# Patient Record
Sex: Male | Born: 1950 | Race: White | Hispanic: No | Marital: Married | Smoking: Former smoker
Health system: Southern US, Community
[De-identification: ages and names within clinical notes are randomized; demographics above are authoritative.]

## PROBLEM LIST (undated history)

## (undated) DIAGNOSIS — M1990 Unspecified osteoarthritis, unspecified site: Secondary | ICD-10-CM

## (undated) DIAGNOSIS — I719 Aortic aneurysm of unspecified site, without rupture: Secondary | ICD-10-CM

## (undated) DIAGNOSIS — I491 Atrial premature depolarization: Secondary | ICD-10-CM

## (undated) DIAGNOSIS — N200 Calculus of kidney: Secondary | ICD-10-CM

## (undated) DIAGNOSIS — K259 Gastric ulcer, unspecified as acute or chronic, without hemorrhage or perforation: Secondary | ICD-10-CM

## (undated) DIAGNOSIS — C649 Malignant neoplasm of unspecified kidney, except renal pelvis: Secondary | ICD-10-CM

## (undated) DIAGNOSIS — E875 Hyperkalemia: Secondary | ICD-10-CM

## (undated) DIAGNOSIS — I351 Nonrheumatic aortic (valve) insufficiency: Secondary | ICD-10-CM

## (undated) DIAGNOSIS — I10 Essential (primary) hypertension: Secondary | ICD-10-CM

## (undated) DIAGNOSIS — R011 Cardiac murmur, unspecified: Secondary | ICD-10-CM

## (undated) DIAGNOSIS — E785 Hyperlipidemia, unspecified: Secondary | ICD-10-CM

## (undated) DIAGNOSIS — I77810 Thoracic aortic ectasia: Secondary | ICD-10-CM

## (undated) HISTORY — DX: Malignant neoplasm of unspecified kidney, except renal pelvis: C64.9

## (undated) HISTORY — DX: Thoracic aortic ectasia: I77.810

## (undated) HISTORY — DX: Essential (primary) hypertension: I10

## (undated) HISTORY — PX: TONSILLECTOMY: SUR1361

## (undated) HISTORY — DX: Nonrheumatic aortic (valve) insufficiency: I35.1

## (undated) HISTORY — DX: Hyperkalemia: E87.5

## (undated) HISTORY — PX: KNEE SURGERY: SHX244

## (undated) HISTORY — DX: Hyperlipidemia, unspecified: E78.5

## (undated) HISTORY — PX: CATARACT EXTRACTION: SUR2

## (undated) HISTORY — DX: Atrial premature depolarization: I49.1

## (undated) HISTORY — PX: KIDNEY SURGERY: SHX687

## (undated) HISTORY — DX: Gastric ulcer, unspecified as acute or chronic, without hemorrhage or perforation: K25.9

---

## 2008-01-27 ENCOUNTER — Emergency Department: Payer: Self-pay | Admitting: Internal Medicine

## 2008-02-07 ENCOUNTER — Ambulatory Visit: Payer: Self-pay | Admitting: Ophthalmology

## 2008-02-08 ENCOUNTER — Ambulatory Visit: Payer: Self-pay | Admitting: Ophthalmology

## 2008-03-28 ENCOUNTER — Ambulatory Visit: Payer: Self-pay | Admitting: Oncology

## 2008-05-18 ENCOUNTER — Inpatient Hospital Stay (HOSPITAL_COMMUNITY): Admission: RE | Admit: 2008-05-18 | Discharge: 2008-05-22 | Payer: Self-pay | Admitting: Urology

## 2008-05-18 ENCOUNTER — Encounter (INDEPENDENT_AMBULATORY_CARE_PROVIDER_SITE_OTHER): Payer: Self-pay | Admitting: Urology

## 2008-11-08 ENCOUNTER — Ambulatory Visit (HOSPITAL_COMMUNITY): Admission: RE | Admit: 2008-11-08 | Discharge: 2008-11-08 | Payer: Self-pay | Admitting: Urology

## 2009-02-21 ENCOUNTER — Ambulatory Visit: Payer: Self-pay | Admitting: Ophthalmology

## 2009-03-02 ENCOUNTER — Ambulatory Visit (HOSPITAL_COMMUNITY): Admission: RE | Admit: 2009-03-02 | Discharge: 2009-03-02 | Payer: Self-pay | Admitting: Urology

## 2009-03-06 ENCOUNTER — Ambulatory Visit: Payer: Self-pay | Admitting: Ophthalmology

## 2010-01-13 HISTORY — PX: BACK SURGERY: SHX140

## 2010-01-28 ENCOUNTER — Ambulatory Visit
Admission: RE | Admit: 2010-01-28 | Discharge: 2010-01-28 | Payer: Self-pay | Source: Home / Self Care | Attending: Surgery | Admitting: Surgery

## 2010-04-23 LAB — BASIC METABOLIC PANEL
BUN: 11 mg/dL (ref 6–23)
BUN: 15 mg/dL (ref 6–23)
BUN: 20 mg/dL (ref 6–23)
CO2: 26 mEq/L (ref 19–32)
CO2: 27 mEq/L (ref 19–32)
Chloride: 104 mEq/L (ref 96–112)
Creatinine, Ser: 1.28 mg/dL (ref 0.4–1.5)
Creatinine, Ser: 1.42 mg/dL (ref 0.4–1.5)
Glucose, Bld: 111 mg/dL — ABNORMAL HIGH (ref 70–99)
Glucose, Bld: 115 mg/dL — ABNORMAL HIGH (ref 70–99)
Potassium: 3.9 mEq/L (ref 3.5–5.1)
Potassium: 4.2 mEq/L (ref 3.5–5.1)
Sodium: 137 mEq/L (ref 135–145)

## 2010-04-23 LAB — TYPE AND SCREEN: ABO/RH(D): B POS

## 2010-04-23 LAB — URINALYSIS, MICROSCOPIC ONLY
Ketones, ur: NEGATIVE mg/dL
Leukocytes, UA: NEGATIVE
Specific Gravity, Urine: 1.01 (ref 1.005–1.030)
pH: 5.5 (ref 5.0–8.0)

## 2010-04-23 LAB — HEMOGLOBIN AND HEMATOCRIT, BLOOD: Hemoglobin: 10.6 g/dL — ABNORMAL LOW (ref 13.0–17.0)

## 2010-04-23 LAB — URINE CULTURE
Colony Count: NO GROWTH
Culture: NO GROWTH

## 2010-04-23 LAB — CREATININE, FLUID (PLEURAL, PERITONEAL, JP DRAINAGE): Creat, Fluid: 1.4 mg/dL

## 2010-04-24 LAB — BASIC METABOLIC PANEL
CO2: 30 mEq/L (ref 19–32)
Calcium: 9.6 mg/dL (ref 8.4–10.5)
Creatinine, Ser: 1.19 mg/dL (ref 0.4–1.5)
GFR calc non Af Amer: >60 mL/min (ref >60)
Glucose, Bld: 97 mg/dL (ref 70–99)
Sodium: 146 mEq/L — ABNORMAL HIGH (ref 135–145)

## 2010-04-24 LAB — CBC
HCT: 38 % — ABNORMAL LOW (ref 39.0–52.0)
RDW: 14.9 % (ref 11.5–15.5)
WBC: 9.4 10*3/uL (ref 4.0–10.5)

## 2010-05-22 ENCOUNTER — Other Ambulatory Visit (HOSPITAL_COMMUNITY): Payer: Self-pay | Admitting: Urology

## 2010-05-22 ENCOUNTER — Ambulatory Visit (HOSPITAL_COMMUNITY)
Admission: RE | Admit: 2010-05-22 | Discharge: 2010-05-22 | Disposition: A | Discharge status: Home / Self Care | Payer: BC Managed Care – PPO | Source: Ambulatory Visit | Attending: Urology | Admitting: Urology

## 2010-05-22 DIAGNOSIS — I771 Stricture of artery: Secondary | ICD-10-CM | POA: Insufficient documentation

## 2010-05-22 DIAGNOSIS — C649 Malignant neoplasm of unspecified kidney, except renal pelvis: Secondary | ICD-10-CM | POA: Insufficient documentation

## 2010-05-22 DIAGNOSIS — I10 Essential (primary) hypertension: Secondary | ICD-10-CM | POA: Insufficient documentation

## 2010-05-28 NOTE — Op Note (Signed)
NAMETYRESSE, JAYSON NO.:  [id number]   MEDICAL RECORD NO.:  [id number]          PATIENT TYPE:  INP   LOCATION:  0004                         FACILITY:  [facility]   PHYSICIAN:  Heloise Purpura, MD      DATE OF BIRTH:  08-22-1950   DATE OF PROCEDURE:  05/18/2008  DATE OF DISCHARGE:                               OPERATIVE REPORT   PREOPERATIVE DIAGNOSIS:  Left renal mass.   POSTOPERATIVE DIAGNOSIS:  Left renal mass.   PROCEDURE:  Left partial nephrectomy.   SURGEON:  Dr. Heloise Purpura.   ASSISTANT:  Delia Chimes, nurse practitioner.   ANESTHESIA:  General.   COMPLICATIONS:  None.   ESTIMATED BLOOD LOSS:  100 mL.   INTRAVENOUS FLUIDS:  3300 cc of lactated Ringer's.   SPECIMENS:  1. Perinephric fat.  2. Left renal mass.  3. Renal tumor margin.   DISPOSITION OF SPECIMENS:  To pathology.   DRAINS:  1. A #15 Blake perinephric drain.  2. A 16-French Foley catheter.   INTRAOPERATIVE FINDINGS:  1. Intraoperative tumor margin analysis was negative for malignancy.  2. Cold ischemia time 28 minutes.   INDICATION:  Mr. Ohair is a 60 year old gentleman who was incidentally  found to have an enhancing left renal mass concerning for renal  malignancy.  He underwent metastatic evaluation which was negative and  after discussion regarding management options for treatment, elected to  proceed with the above procedure.  The potential risks, complications,  and alternative treatment options were discussed in detail and informed  consent was obtained.   DESCRIPTION OF PROCEDURE:  The patient was taken to the operating room  and a general anesthetic was administered.  He was given preoperative  antibiotics, placed in the supine position with the table flexed, and  prepped and draped in the usual sterile fashion.  Next, an incision was  made approximately two fingerbreadths below the costal margin in the  left upper quadrant extending toward the tip of the eleventh  rib.  This  was carried down through the subcutaneous tissues with electrocautery  and the anterior rectus fascia was incised.  The underlying rectus  muscle was divided and the posterior rectus sheath was then sharply  divided allowing entry into the peritoneal cavity under direct vision  without difficulty.  The fascial layers of the abdomen were then opened  along the length of the incision and a self-retaining Bookwalter  retractor was placed.  The white line of Toldt was then incised along  the length of the descending colon allowing the colon to be mobilized  medially and the space between Gerota's fascia and the mesocolon to be  developed, as well as the posterior space between the abdominal wall and  kidney to be developed.  The ureter was identified and was lifted  anteriorly off the psoas muscle.  Dissection then proceeded superiorly  and the main renal vein was identified.  This was able to be isolated  with right-angle dissection and a vessel loop was placed around the  renal vein.  A single renal artery that was branching was  identified  just posterior to the renal vein and was able to be isolated proximal to  where it branched.  It also was able to be isolated and secured with a  vessel loop.  Attention then turned to the lower pole of the kidney.  Gerota's fascia was entered and the perinephric fat was removed from the  normal renal capsule.  The patient's lower pole mass was identified and  appeared to be bilobed.  The perinephric fat overlying this mass was  excised and sent as a pathologic specimen.  The superior and posterior  attachments to the kidney were taken down with a combination of blunt  dissection and cautery.  Once the kidney was freed up adequately, 12.5  grams of intravenous mannitol were administered.  After the mannitol had  been in for a sufficient time, a bowel bag was placed around the kidney  to isolate it from the surrounding structures.  The renal  artery was  then clamped with a bulldog clamp.  The renal vein was subsequently  clamped.  Ice slush was then placed around the kidney and cooled for  approximately 10 minutes to provide cold ischemia.  At this point, the  ice slush surrounding the tumor was removed and the tumor was then  excised with sharp scissor dissection.  The tumor was noted on imaging  to abut the collecting system and entry into the collecting system was  noted during the dissection which was carried down to the renal sinus  fat.  The resected tumor was inspected grossly and appeared to be  grossly excised completely.  Additional sections of the deep resection  site were sent as separate intraoperative frozen sections for analysis  and were negative for malignancy.  Attention then turned to repair of  the renal defect.  The collecting system, as well as any obvious  bleeding arcuate vessels were identified and were oversewn with 2-0  Vicryl sutures.  The argon beam was used to coagulate the edges of the  renal capsule parenchyma and FloSeal was placed into the renal defect.  A Surgicel bolster was then placed into the defect and secured with a  double-armed 2-0 Monocryl suture which provided renal compression.  This  was secured with Hem-o-lok clips and lapra-ties.  The renal vein and  then the renal artery were both unclamped with a total cold ischemia  time of 28 minutes.  An additional 12.5 grams of intravenous mannitol  were then administered.  There appeared to be excellent hemostasis.  Additional FloSeal was placed over the defect for security purposes.  The kidney was then placed back into the renal fossa.  A #15 Blake drain  was brought through a separate stab incision and placed into the  perinephric space.  It was secured to the skin with a nylon suture.  Preparations were then made for closure.  A #1 running PDS suture was  used to close the transversus abdominis and internal oblique fascial  layers  and run continuously with the posterior rectus sheath layer.  The  external oblique and anterior rectus fascial layer was then closed  separately with another running #1 PDS suture.  The superficial wound  was copiously irrigated with sterile saline and the skin was  reapproximated with staples.  A sterile dressing was applied.  The  patient appeared to tolerate the procedure well and without  complications.  He was able to be extubated and transferred to the  recovery unit in satisfactory condition.  Heloise Purpura, MD  Electronically Signed     LB/MEDQ  D:  05/18/2008  T:  05/18/2008  Job:  [id number]

## 2010-05-28 NOTE — Assessment & Plan Note (Signed)
OFFICE VISIT   FALLON, HOWERTER  DOB:  1950-11-28                                       01/28/2010  [id number]#:[id number]   REASON FOR CONSULTATION:  Preoperative evaluation for anterior approach.   HISTORY:  This is a 59-year gentleman seen at the request of Dr. Shon Baton  for discussion of anterior exposure for L4-L5 disk replacement.  Patient  has had  chronic back pain which has failed nonoperative therapy.  He  has gotten to the point where he is presumed to be a candidate for  operative repair.   The patient suffers from hypertension and hypercholesterolemia, both  which are medically managed.  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  He has been cancer free since his  operation which was in May 2010.   REVIEW OF SYSTEMS:  GENERAL:  Positive for weight gain.  CARDIAC:  Positive for a heart murmur and aortic insufficiency.  GI:  Positive for peptic ulcer disease.  GU:  Positive for renal cancer.  All other review systems are negative except for musculoskeletal which  is positive for back pain.   PAST MEDICAL HISTORY:  Hypertension, hypercholesterolemia, renal cancer.   SOCIAL HISTORY:  He is married.  Works as a Museum/gallery curator.  Quit smoking  in 1980.  Does not drink.   FAMILY HISTORY:  Positive for hypertension in his father.   PHYSICAL EXAMINATION:  Heart rate 60, blood pressure 149/81, O2 sats  100%.  GENERAL:  He is well-appearing, in no distress.  HEENT:  Within normal limits.  LUNGS:  Clear bilaterally.  CARDIOVASCULAR:  He has palpable pedal pulses.  ABDOMEN:  Soft, obese.  He has an insensate area in the lower lateral  side.  He has a well healed left subcostal incision.  There is a slight  bulge in the lateral aspect of his abdomen.  Neurologically he is intact.  SKIN:  Without rash.   PLAN:  The patient is most likely to proceed with anterior exposure for  L4-L5 fusion.  The patient is being set up for cardiac  clearance given  his aortic insufficiency and heart murmur.  I feel that we could proceed  with the anterior exposure.  I am a little concerned given his partial  nephrectomy that there could be dense adhesions and scar tissue in the  area we are trying to work which could make the operation somewhat more  challenging.  I detailed the risks of ureteral injury, as well as injury  to the artery or vein which could occur due to the operation.  I told  him that we will be monitoring for these and should any injury occur, it  would be repaired at that time.  We also discussed that if the scar  tissue was too dense that it may not be advisable to proceed with an  anterior repair,.  The patient is going to contact Dr. Shon Baton for  scheduling in late March or early April.     Jorge Ny, MD  Electronically Signed   VWB/MEDQ  D:  01/28/2010  T:  01/29/2010  Job:  [id number]

## 2010-05-31 NOTE — Discharge Summary (Signed)
Geoffrey [name], Geoffrey [name] NO.:  [id number]   MEDICAL RECORD NO.:  [id number]          PATIENT TYPE:  INP   LOCATION:  1410                         FACILITY:  [facility]   PHYSICIAN:  Heloise Purpura, MD      DATE OF BIRTH:  20-May-1950   DATE OF ADMISSION:  05/18/2008  DATE OF DISCHARGE:  05/22/2008                               DISCHARGE SUMMARY   ADMISSION DIAGNOSIS:  Left renal mass.   DISCHARGE DIAGNOSIS:  Renal cell carcinoma.   PROCEDURE:  Left partial nephrectomy.   HISTORY/PHYSICAL:  For full details, please see admission history and  physical.  Briefly, Geoffrey [name] is a 60 year old gentleman who was  incidentally found to have an enhancing left renal mass concerning for  renal malignancy.  He underwent metastatic evaluation which was  negative.  After discussion regarding management options for treatment,  he elected to proceed with surgical therapy consisting of a left partial  nephrectomy.   HOSPITAL COURSE:  On May 18, 2008, he was taken to the operating room and  underwent the above named procedure.  He appeared to tolerate the  procedure well without complications.  Postoperatively, he was able to  be transferred to a regular hospital room where he was monitored  overnight and remained hemodynamically stable.  His postoperative  hemoglobin was 10.8 and again checked on postoperative day number 1, and  remained stable at 10.6.  He was maintained on strict bedrest for 24  hours.  His serum creatinine was checked on postoperative day number 1  and found within normal limits at 1.28.  It did subsequently increase  over postoperative day number 2 and 3 to 1.42 which was stable compared  to his baseline.Marland Kitchen  He was able to begin a clear liquid diet on  postoperative day number 1 which he tolerated without difficulty.  He  was able to be transitioned to a regular diet over the next 24-48 hours.  He began ambulating after bedrest for 24 hours in which he did without  difficulty.  He was transitioned to oral pain medications.  He did  require several changes in medications to gain pain control.  He  maintained excellent urine output within the first 24 hours after  surgery.  His Foley catheter was left in place to monitor his urine  output since the collecting system had been entered during his surgical  removal of his mass.  His urine output did remain stable and urine began  to clear without clots.  Therefore, his Foley catheter was removed on  postoperative day number 2.  He had minimal output from his perinephric  drain and it was sent to check for a creatinine level and found to be  consistent with serum at 1.4.  Therefore, the perinephric drain was  removed.  He did have an elevated temperature on postoperative days 1-2.  He was instructed on the use of his incentive spirometer and ambulated.  Urinalysis and culture were sent to check to evaluate him for an UTI.  His urine cultures were negative and his white blood cell count  remained  within normal limits. His temperature did subsequently decrease and was  felt to be likely secondary to some atelectasis.  On postoperative day  number 4, he was tolerating his diet, ambulating without difficulty and  his pain was controlled with medications.  He was therefore able to be  discharged home in excellent condition.   DISPOSITION:  Home.   DISCHARGE MEDICATIONS:  He was instructed to resume his regular home  medications consisting of:  1. Crestor.  2. Azor.  3. Metoprolol.  4. Tekturna.  5. He was instructed to hold his aspirin for 7-10 days      postoperatively.  6. In addition, he was provided a prescription for OxyContin and      oxycodone to use as needed for pain.  7. Told to use Colace as a stool softener.   DISCHARGE INSTRUCTIONS:  1. He was instructed to be ambulatory, but specifically told to      refrain from any heavy lifting, strenuous activity or driving.  2. He was instructed to  resume a regular diet.   FOLLOW UP:  He will return in 10-14 days for further postoperative  evaluation.   PATHOLOGY:  His pathology returned as renal cell carcinoma, chromophobe  cell variant.      Delia Chimes, NP      Heloise Purpura, MD  Electronically Signed    MA/MEDQ  D:  05/23/2008  T:  05/23/2008  Job:  [id number]

## 2010-06-05 ENCOUNTER — Other Ambulatory Visit (HOSPITAL_COMMUNITY): Payer: Self-pay

## 2010-06-11 ENCOUNTER — Inpatient Hospital Stay (HOSPITAL_COMMUNITY): Admission: RE | Admit: 2010-06-11 | Payer: Self-pay | Source: Ambulatory Visit

## 2010-06-12 ENCOUNTER — Inpatient Hospital Stay (HOSPITAL_COMMUNITY): Admission: RE | Admit: 2010-06-12 | Payer: BC Managed Care – PPO | Source: Ambulatory Visit | Admitting: Neurosurgery

## 2010-07-14 ENCOUNTER — Emergency Department (HOSPITAL_COMMUNITY)
Admission: EM | Admit: 2010-07-14 | Discharge: 2010-07-14 | Disposition: A | Discharge status: Home / Self Care | Payer: BC Managed Care – PPO | Attending: Emergency Medicine | Admitting: Emergency Medicine

## 2010-07-14 ENCOUNTER — Emergency Department (HOSPITAL_COMMUNITY): Payer: BC Managed Care – PPO

## 2010-07-14 DIAGNOSIS — I428 Other cardiomyopathies: Secondary | ICD-10-CM | POA: Insufficient documentation

## 2010-07-14 DIAGNOSIS — R51 Headache: Secondary | ICD-10-CM | POA: Insufficient documentation

## 2010-07-14 DIAGNOSIS — R002 Palpitations: Secondary | ICD-10-CM | POA: Insufficient documentation

## 2010-07-14 DIAGNOSIS — I10 Essential (primary) hypertension: Secondary | ICD-10-CM | POA: Insufficient documentation

## 2010-07-14 LAB — CBC
HCT: 41.2 % (ref 39.0–52.0)
MCH: 28.9 pg (ref 26.0–34.0)
RBC: 4.7 MIL/uL (ref 4.22–5.81)
WBC: 8.4 10*3/uL (ref 4.0–10.5)

## 2010-07-14 LAB — URINALYSIS, ROUTINE W REFLEX MICROSCOPIC
Bilirubin Urine: NEGATIVE
Glucose, UA: NEGATIVE mg/dL
Nitrite: NEGATIVE
Specific Gravity, Urine: 1.019 (ref 1.005–1.030)
pH: 5.5 (ref 5.0–8.0)

## 2010-07-14 LAB — BASIC METABOLIC PANEL
BUN: 14 mg/dL (ref 6–23)
Calcium: 9.1 mg/dL (ref 8.4–10.5)
Creatinine, Ser: 1.05 mg/dL (ref 0.50–1.35)
GFR calc Af Amer: >60 mL/min (ref >60)
GFR calc non Af Amer: >60 mL/min (ref >60)
Glucose, Bld: 107 mg/dL — ABNORMAL HIGH (ref 70–99)

## 2010-07-14 LAB — DIFFERENTIAL
Basophils Absolute: 0.1 10*3/uL (ref 0.0–0.1)
Basophils Relative: 1 % (ref 0–1)
Neutrophils Relative %: 67 % (ref 43–77)

## 2010-07-14 LAB — APTT: aPTT: 29 seconds (ref 24–37)

## 2010-07-14 LAB — URINE MICROSCOPIC-ADD ON

## 2010-07-14 LAB — TROPONIN I: Troponin I: <0.3 ng/mL (ref <0.30)

## 2010-08-14 ENCOUNTER — Encounter (HOSPITAL_COMMUNITY)
Admission: RE | Admit: 2010-08-14 | Discharge: 2010-08-14 | Disposition: A | Discharge status: Home / Self Care | Payer: BC Managed Care – PPO | Source: Ambulatory Visit | Attending: Neurosurgery | Admitting: Neurosurgery

## 2010-08-14 LAB — BASIC METABOLIC PANEL
Calcium: 10.7 mg/dL — ABNORMAL HIGH (ref 8.4–10.5)
Chloride: 103 mEq/L (ref 96–112)
Creatinine, Ser: 1.39 mg/dL — ABNORMAL HIGH (ref 0.50–1.35)
GFR calc Af Amer: >60 mL/min (ref >60)
Sodium: 141 mEq/L (ref 135–145)

## 2010-08-14 LAB — CBC
MCH: 29.4 pg (ref 26.0–34.0)
MCV: 86.3 fL (ref 78.0–100.0)
Platelets: 245 10*3/uL (ref 150–400)
RDW: 13.9 % (ref 11.5–15.5)
WBC: 8.7 10*3/uL (ref 4.0–10.5)

## 2010-08-14 LAB — TYPE AND SCREEN
ABO/RH(D): B POS
Antibody Screen: NEGATIVE

## 2010-08-14 LAB — SURGICAL PCR SCREEN: MRSA, PCR: NEGATIVE

## 2010-08-26 ENCOUNTER — Inpatient Hospital Stay (HOSPITAL_COMMUNITY): Payer: BC Managed Care – PPO

## 2010-08-26 ENCOUNTER — Inpatient Hospital Stay (HOSPITAL_COMMUNITY)
Admission: RE | Admit: 2010-08-26 | Discharge: 2010-08-29 | DRG: 756 | Disposition: A | Discharge status: Home / Self Care | Payer: BC Managed Care – PPO | Source: Ambulatory Visit | Attending: Neurosurgery | Admitting: Neurosurgery

## 2010-08-26 DIAGNOSIS — E7800 Pure hypercholesterolemia, unspecified: Secondary | ICD-10-CM | POA: Diagnosis present

## 2010-08-26 DIAGNOSIS — M51379 Other intervertebral disc degeneration, lumbosacral region without mention of lumbar back pain or lower extremity pain: Principal | ICD-10-CM | POA: Diagnosis present

## 2010-08-26 DIAGNOSIS — I10 Essential (primary) hypertension: Secondary | ICD-10-CM | POA: Diagnosis present

## 2010-08-26 DIAGNOSIS — Z87891 Personal history of nicotine dependence: Secondary | ICD-10-CM

## 2010-08-26 DIAGNOSIS — Z01818 Encounter for other preprocedural examination: Secondary | ICD-10-CM

## 2010-08-26 DIAGNOSIS — Z7982 Long term (current) use of aspirin: Secondary | ICD-10-CM

## 2010-08-26 DIAGNOSIS — M5137 Other intervertebral disc degeneration, lumbosacral region: Principal | ICD-10-CM | POA: Diagnosis present

## 2010-08-28 NOTE — Op Note (Signed)
Geoffrey [name], [name] NO.:  [id number]  MEDICAL RECORD NO.:  [id number]  LOCATION:  3017                         FACILITY:  [facility]  PHYSICIAN:  Donalee Citrin, M.D.        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  PROCEDURE:  Posterior lumbar interbody fusion, L4-5 using Telamon PEEK cage 10 x 20 mm packed with locally harvested graft mixed with Actifuse and tangent allograft wedge 10 x 26 mm, pedicle screw fixation using globus pedicle screw system, posterolateral arthrodesis L4-5 and placement of a large Hemovac drain.  SURGEON:  Donalee Citrin, MD.  ASSISTANT:  Kathaleen Maser. Pool, MD.  ANESTHESIA:  General endotracheal.  HISTORY OF PRESENT ILLNESS:  The patient is a very pleasant 60 year old gentleman who has had 20-30 years of back, left leg pain.  This had been refractory to all forms of treatment, anti-inflammatories, physical therapy, epidural steroid injections, and medications.  MRI scan showed severe degenerate collapse with severe foraminal stenosis of the L4 and L5, predominantly on the L4 and predominantly because of the degenerative collapse at the disk space at L4-5.  Due to the patient's MRI findings, clinical exam, the patient was recommended laminectomy, decompression, and stabilization. Procedural risks and benefits of the operation were explained to the patient.  He understood and agreed to proceed forth.  The patient was brought to the OR, was induced general anesthesia, positioned prone on the Wilson frame.  Back was prepped and draped in usual sterile fashion.  Preoperative x-ray localized the appropriate level.  After infiltration of 10 mL of lidocaine with epi, a midline incision was made.  Bovie electrocautery was used to  take down the subcutaneous tissues.  Subperiosteal dissection was carried out in the lamina of L4 and L5 exposing the T-piece at L4 and L5 bilaterally. Intraoperative x-ray identified the L5 T-piece, so the spinous process of L4 was removed.  Central decompression was begun.  The complete medial facetectomies were performed.  There was marked facet arthropathy causing severe stenosis of both L4 roots predominantly due to superior articular facet arthropathy in the L4 foramen.  This was all underbitten and L4 roots were skeletonized and decompressed as well as L5 roots. Under biting of the superior articular facet allowed access to lateral marginal disk space and then after this was achieved, attention was taken to the pedicle screw placement.  Using a high-speed drill, pedicle was drilled, the cannula with the awl probed, tapped with the 5.5 tap, probed again, and 6 x 45 screw inserted at L4.  Again using external and internal bony landmarks and fluoroscopy each step along the way, this confirmed no mediolateral breach of the pedicle.  Then, 6 x 40 was inserted at L5 and 6 x 45 at L4 on the contralateral side 6 x 40 L5. After all four screws were placed, the distractor was inserted.  This opened up the disk space and opened up the L4 foramen.  Then working  on the contralateral side, disk space was cleaned out and a size 10 distractor further opened up the disk space even more as well as decompressing the L4 foramen, was felt to be appropriate size and the graft material sewn in the disk space was prepped on the opposite side. Endplates were scraped.  Central disk was removed.  Using a size 10 cutter and chisel, was prepped again.  A Telamon PEEK cage packed with local autograft mixed with Actifuse and inserted on the patient's right side followed by tangent allograft wedge on the patient's left side with local autograft packed centrally.  Then, the wound was copiously irrigated.   Meticulous hemostasis was then obtained, aggressive decortication of T-piece and lateral gutters.  Local autograft was packed posterolaterally.  A 45-mm rod was placed on the right and a 50- mm rod was placed on the left anchoring knots screws down at L5.  They were torqued down and L4 was compressed against L5 before final torque tightening was achieved.  Then, fluoroscopy confirmed good position of the screws, rods, and implants.  A large Hemovac drain was placed and was closed in layers with Vicryl and skin was closed with running 4-0 subcuticular.  Benzoin an Steri-Strips were applied.  The patient went to the recovery room in stable condition.          ______________________________ Donalee Citrin, M.D.     GC/MEDQ  D:  08/26/2010  T:  08/26/2010  Job:  [id number]  Electronically Signed by Donalee Citrin M.D. on 08/28/2010 10:20:40 AM

## 2010-08-28 NOTE — H&P (Signed)
  NAMEGERONIMO, DILIBERTO NO.:  [id number]  MEDICAL RECORD NO.:  [id number]  LOCATION:  2899                         FACILITY:  [facility]  PHYSICIAN:  Donalee Citrin, M.D.        DATE OF BIRTH:  [birth year]  DATE OF ADMISSION:  08/26/2010 DATE OF DISCHARGE:                             HISTORY & PHYSICAL   ADMITTING DIAGNOSIS:  Back and left leg pain with degenerative disc disease and lumbar spinal stenosis at L4-5.  HISTORY OF PRESENT ILLNESS:  The patient is a 60 year old gentleman back in 1973, had a power lawn mower fall on top of him and  ever since then, he has had chronic low back and left leg pain.  He underwent multiple visits to a chiropractor, underwent steroid injections as well as oral steroids, narcotic pain medications, and failed all forms of conservative treatment.  MRI scan showed severe lumbar spinal stenosis and degenerative disc disease and the patient was recommended decompression stabilization procedure.  We went over the risks and benefits and he agreed to proceed forward.  PAST MEDICAL HISTORY:  Remarkable for hypertension, hypercholesteremia.  SURGICAL HISTORY:  Includes cataract surgery, tonsillectomy, partial nephrectomy for renal cell in May 2010, left knee arthroscopy.  SOCIAL HISTORY:  He is a nonsmoker and occasional drinker.  ALLERGIES:  No known allergies.  CURRENT MEDICATION LIST:  Includes Crestor, hydrochlorothiazide, oxycodone, metoprolol, hydrocodone, fish oil, clonidine patch, and aspirin.  PHYSICAL EXAMINATION:  GENERAL:  The patient is very pleasant, awake and alert, 60 year old gentleman, in no acute distress. HEENT:  Within normal limits. EXTREMITIES:  Lower extremity strength is 5/5 including iliopsoas, quads, hamstrings, gastrocs, and EHL.  MRI scan showed degenerative collapse and lumbar spinal stenosis at L4- 5, biforaminal stenosis at L4 and L5 nerve root.  The patient was recommended stabilization procedures and  he understands the risks and benefits and agreed to proceed forward.  The patient admitted at this time for the operation.          ______________________________ Donalee Citrin, M.D.     GC/MEDQ  D:  08/26/2010  T:  08/26/2010  Job:  [id number]  Electronically Signed by Donalee Citrin M.D. on 08/28/2010 10:20:46 AM

## 2010-09-26 ENCOUNTER — Ambulatory Visit
Admission: RE | Admit: 2010-09-26 | Discharge: 2010-09-26 | Disposition: A | Discharge status: Home / Self Care | Payer: BC Managed Care – PPO | Source: Ambulatory Visit | Attending: Neurosurgery | Admitting: Neurosurgery

## 2010-09-26 ENCOUNTER — Other Ambulatory Visit: Payer: Self-pay | Admitting: Neurosurgery

## 2010-09-26 DIAGNOSIS — M25559 Pain in unspecified hip: Secondary | ICD-10-CM

## 2010-09-26 DIAGNOSIS — M545 Low back pain: Secondary | ICD-10-CM

## 2010-10-10 NOTE — Discharge Summary (Signed)
  NAMELEVEON, PELZER NO.:  [id number]  MEDICAL RECORD NO.:  [id number]  LOCATION:  3017                         FACILITY:  [facility]  PHYSICIAN:  Donalee Citrin, M.D.        DATE OF BIRTH:  06/11/1950  DATE OF ADMISSION:  08/26/2010 DATE OF DISCHARGE:  08/29/2010                              DISCHARGE SUMMARY   ADMITTING DIAGNOSIS:  Degenerative disk disease, lumbar spinal stenosis at L4-5.  PROCEDURE DURING HOSPITALIZATION:  Decompressive laminectomy and posterior lumbar interbody fusion at L4-5.  HOSPITAL COURSE:  The patient is a very pleasant 60 year old gentleman who was admitted as an early-morning admission, went to the operating room, underwent the aforementioned procedure.  Postoperatively, the patient did very well, recovering in the floor.  On the floor, over the couple of postoperative days, the patient progressively mobilized well. Leg pain was completely resolved.  Pain became progressively more controlled under p.o. pain medication and his PCA was able to be discontinued on postop day 2.  His Foley was discontinued on postop day 1.  He was ambulating and voiding spontaneously and by postop day 3, the patient was able to be discharged home with scheduled followup in approximately 2 weeks.  At the time of discharge, the patient will be discharged on Percocet and Valium.          ______________________________ Donalee Citrin, M.D.     GC/MEDQ  D:  08/29/2010  T:  08/29/2010  Job:  [id number]  Electronically Signed by Donalee Citrin M.D. on 10/10/2010 01:07:52 PM

## 2010-10-24 ENCOUNTER — Other Ambulatory Visit: Payer: Self-pay | Admitting: Neurosurgery

## 2010-10-24 ENCOUNTER — Ambulatory Visit
Admission: RE | Admit: 2010-10-24 | Discharge: 2010-10-24 | Disposition: A | Discharge status: Home / Self Care | Payer: BC Managed Care – PPO | Source: Ambulatory Visit | Attending: Neurosurgery | Admitting: Neurosurgery

## 2010-10-24 DIAGNOSIS — M5137 Other intervertebral disc degeneration, lumbosacral region: Secondary | ICD-10-CM

## 2011-02-04 ENCOUNTER — Other Ambulatory Visit: Payer: Self-pay | Admitting: Neurosurgery

## 2011-02-04 ENCOUNTER — Ambulatory Visit
Admission: RE | Admit: 2011-02-04 | Discharge: 2011-02-04 | Disposition: A | Discharge status: Home / Self Care | Payer: BC Managed Care – PPO | Source: Ambulatory Visit | Attending: Neurosurgery | Admitting: Neurosurgery

## 2011-02-04 DIAGNOSIS — M25559 Pain in unspecified hip: Secondary | ICD-10-CM

## 2011-02-04 DIAGNOSIS — M545 Low back pain: Secondary | ICD-10-CM

## 2011-06-05 ENCOUNTER — Other Ambulatory Visit (HOSPITAL_COMMUNITY): Payer: Self-pay | Admitting: Urology

## 2011-06-05 ENCOUNTER — Ambulatory Visit (HOSPITAL_COMMUNITY)
Admission: RE | Admit: 2011-06-05 | Discharge: 2011-06-05 | Disposition: A | Discharge status: Home / Self Care | Payer: BC Managed Care – PPO | Source: Ambulatory Visit | Attending: Urology | Admitting: Urology

## 2011-06-05 DIAGNOSIS — C649 Malignant neoplasm of unspecified kidney, except renal pelvis: Secondary | ICD-10-CM | POA: Insufficient documentation

## 2011-10-31 ENCOUNTER — Other Ambulatory Visit: Payer: Self-pay | Admitting: Gastroenterology

## 2012-05-31 ENCOUNTER — Other Ambulatory Visit (HOSPITAL_COMMUNITY): Payer: Self-pay | Admitting: Urology

## 2012-05-31 ENCOUNTER — Ambulatory Visit (HOSPITAL_COMMUNITY)
Admission: RE | Admit: 2012-05-31 | Discharge: 2012-05-31 | Disposition: A | Discharge status: Home / Self Care | Payer: BC Managed Care – PPO | Source: Ambulatory Visit | Attending: Urology | Admitting: Urology

## 2012-05-31 DIAGNOSIS — C649 Malignant neoplasm of unspecified kidney, except renal pelvis: Secondary | ICD-10-CM | POA: Insufficient documentation

## 2012-05-31 DIAGNOSIS — I10 Essential (primary) hypertension: Secondary | ICD-10-CM | POA: Insufficient documentation

## 2012-05-31 DIAGNOSIS — M47814 Spondylosis without myelopathy or radiculopathy, thoracic region: Secondary | ICD-10-CM | POA: Insufficient documentation

## 2012-11-12 ENCOUNTER — Other Ambulatory Visit: Payer: Self-pay | Admitting: Cardiology

## 2012-11-18 ENCOUNTER — Encounter: Payer: Self-pay | Admitting: Cardiology

## 2013-01-13 ENCOUNTER — Other Ambulatory Visit: Payer: Self-pay | Admitting: Cardiology

## 2013-02-08 ENCOUNTER — Encounter: Payer: Self-pay | Admitting: *Deleted

## 2013-02-08 ENCOUNTER — Encounter: Payer: Self-pay | Admitting: Cardiology

## 2013-02-14 ENCOUNTER — Ambulatory Visit: Payer: BC Managed Care – PPO | Admitting: Cardiology

## 2013-02-16 ENCOUNTER — Ambulatory Visit (INDEPENDENT_AMBULATORY_CARE_PROVIDER_SITE_OTHER): Payer: BC Managed Care – PPO | Admitting: Cardiology

## 2013-02-16 ENCOUNTER — Encounter: Payer: Self-pay | Admitting: Cardiology

## 2013-02-16 VITALS — BP 150/90 | HR 72 | Ht 69.0 in | Wt 244.0 lb

## 2013-02-16 DIAGNOSIS — I491 Atrial premature depolarization: Secondary | ICD-10-CM

## 2013-02-16 DIAGNOSIS — I359 Nonrheumatic aortic valve disorder, unspecified: Secondary | ICD-10-CM | POA: Insufficient documentation

## 2013-02-16 DIAGNOSIS — I77810 Thoracic aortic ectasia: Secondary | ICD-10-CM

## 2013-02-16 DIAGNOSIS — I10 Essential (primary) hypertension: Secondary | ICD-10-CM | POA: Insufficient documentation

## 2013-02-16 NOTE — Progress Notes (Signed)
  [place], [place]  [address] Phone: [phone] Fax:  [phone]  Date:  02/16/2013   ID:  Geoffrey [name], DOB 1950-06-04, MRN [id number]  PCP:  [name], MD  Cardiologist:  Fransico Him, MD   History of Present Illness: Geoffrey [name] is a 63 y.o. male with a history of dilated aortic root, mild to moderate AI and PAC's who presents today for followup. He is doing well.  He denies any chest pain, SOB, DOE, LE edema, dizziness, palpitations or syncope.   Wt Readings from Last 3 Encounters:  No data found for Wt     Past Medical History  Diagnosis Date  . HTN (hypertension)   . 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 Hyperkalemia   . Dyslipidemia   . Gastric ulcer   . Renal cell cancer   . PAC (premature atrial contraction)   . Dilated aortic root     Current Outpatient Prescriptions  Medication Sig Dispense Refill  . Aliskiren-Hydrochlorothiazide (TEKTURNA HCT) 300-25 MG TABS Take 1 tablet by mouth daily.      Marland Kitchen aspirin 81 MG tablet Take 81 mg by mouth daily.      . AZOR 10-40 MG per tablet TAKE 1 TABLET EVERY DAY  30 tablet  6  . CRESTOR 40 MG tablet       . metoprolol tartrate (LOPRESSOR) 25 MG tablet TAKE 1 TABLET BY MOUTH TWICE A DAY  60 tablet  3  . Omega-3 Fatty Acids (FISH OIL PO) Take 1 tablet by mouth daily.       No current facility-administered medications for this visit.    Allergies:    Allergies  Allergen Reactions  . Shrimp [Shellfish Allergy]   . Zetia [Ezetimibe]     rash    Social History:  The patient  reports that he quit smoking about 35 years ago. He does not have any smokeless tobacco history on file. He reports that he drinks alcohol. He reports that he does not use illicit drugs.   Family History:  The patient's family history includes Breast cancer in his sister; Dementia in his mother; Hypertension in his father and sister.   ROS:  Please see the history of present illness.      All other  systems reviewed and negative.   PHYSICAL EXAM: VS:  There were no vitals taken for this visit. Well nourished, well developed, in no acute distress HEENT: normal Neck: no JVD Cardiac:  normal S1, S2; RRR; no murmur Lungs:  clear to auscultation bilaterally, no wheezing, rhonchi or rales Abd: soft, nontender, no hepatomegaly Ext: no edema Skin: warm and dry Neuro:  CNs 2-12 intact, no focal abnormalities noted  EKG: NSR with lateral T wave abnormality in !, aVL, V5-V6 - slightly different from a year ago in V5  ASSESSMENT AND PLAN:  1. Moderate AI  - recheck echo 2.   Mild aortic root dilatation  - repeat echo  2. PAC's asymptomatic 3. HTN - mildly elevated - at home it runs 140/21mmHg  - continue Azor/Tekturna/Metoprolol  - check BP daily for a week and call with the results  - check BMET  Followup with me in 1 year  Signed, Fransico Him, MD 02/16/2013 4:30 PM

## 2013-02-16 NOTE — Patient Instructions (Signed)
Your physician recommends that you continue on your current medications as directed. Please refer to the Current Medication list given to you today.  Your physician has requested that you have an echocardiogram. Echocardiography is a painless test that uses sound waves to create images of your heart. It provides your doctor with information about the size and shape of your heart and how well your heart's chambers and valves are working. This procedure takes approximately one hour. There are no restrictions for this procedure. ( You are scheduled for 04/12/13 at 1:00 Pm)  Your physician recommends that you go to the lab today for a BMET  Your physician wants you to follow-up in: 1 Year with Dr Mallie Snooks will receive a reminder letter in the mail two months in advance. If you don't receive a letter, please call our office to schedule the follow-up appointment.

## 2013-02-17 LAB — BASIC METABOLIC PANEL
BUN: 22 mg/dL (ref 6–23)
CO2: 24 mEq/L (ref 19–32)
Calcium: 9.9 mg/dL (ref 8.4–10.5)
Chloride: 110 mEq/L (ref 96–112)
Creatinine, Ser: 1.2 mg/dL (ref 0.4–1.5)
GFR: 63.43 mL/min (ref >60.00)
Glucose, Bld: 86 mg/dL (ref 70–99)
POTASSIUM: 4.3 meq/L (ref 3.5–5.1)
Sodium: 144 mEq/L (ref 135–145)

## 2013-02-17 NOTE — Progress Notes (Signed)
Quick Note:  Preliminary report reviewed by triage nurse and sent to MD desk. ______ 

## 2013-02-18 ENCOUNTER — Encounter: Payer: Self-pay | Admitting: General Surgery

## 2013-03-25 NOTE — Progress Notes (Signed)
Surgery on 04/07/13.  Proep on 03/31/13 at 230pm.  Need orders in EPIC,  Thank You.

## 2013-03-30 ENCOUNTER — Encounter (HOSPITAL_COMMUNITY): Payer: Self-pay | Admitting: Pharmacy Technician

## 2013-03-31 ENCOUNTER — Encounter (HOSPITAL_COMMUNITY)
Admission: RE | Admit: 2013-03-31 | Discharge: 2013-03-31 | Disposition: A | Discharge status: Home / Self Care | Payer: BC Managed Care – PPO | Source: Ambulatory Visit | Attending: Orthopedic Surgery | Admitting: Orthopedic Surgery

## 2013-03-31 ENCOUNTER — Encounter (HOSPITAL_COMMUNITY): Payer: Self-pay

## 2013-03-31 DIAGNOSIS — Z01812 Encounter for preprocedural laboratory examination: Secondary | ICD-10-CM | POA: Insufficient documentation

## 2013-03-31 LAB — CBC
HCT: 40.7 % (ref 39.0–52.0)
HEMOGLOBIN: 13.3 g/dL (ref 13.0–17.0)
MCH: 28.9 pg (ref 26.0–34.0)
MCHC: 32.7 g/dL (ref 30.0–36.0)
MCV: 88.3 fL (ref 78.0–100.0)
Platelets: 237 10*3/uL (ref 150–400)
RBC: 4.61 MIL/uL (ref 4.22–5.81)
RDW: 14.4 % (ref 11.5–15.5)
WBC: 9.3 10*3/uL (ref 4.0–10.5)

## 2013-03-31 LAB — BASIC METABOLIC PANEL
BUN: 19 mg/dL (ref 6–23)
CHLORIDE: 105 meq/L (ref 96–112)
CO2: 24 meq/L (ref 19–32)
Calcium: 9.4 mg/dL (ref 8.4–10.5)
Creatinine, Ser: 1.16 mg/dL (ref 0.50–1.35)
GFR calc Af Amer: 77 mL/min — ABNORMAL LOW (ref >90)
GFR calc non Af Amer: 66 mL/min — ABNORMAL LOW (ref >90)
Glucose, Bld: 95 mg/dL (ref 70–99)
Potassium: 4.4 mEq/L (ref 3.7–5.3)
SODIUM: 142 meq/L (ref 137–147)

## 2013-03-31 NOTE — Progress Notes (Signed)
Chest x ray 5/14, eccho 3/14, LOV and EKG Dr Radford Pax 2/15 EPIC

## 2013-03-31 NOTE — Patient Instructions (Addendum)
      Your procedure is scheduled on:  04/07/13  THURSDAY  Report to [facility] at  1015     AM.  Call this number if you have problems the morning of surgery: [phone]        Do not eat food:After Midnight. Wednesday NIGHT-- MAY HAVE CLEAR LIQUIDS Thursday MORNING UNTIL 0645 am-  THEN NOTHING BY MOUTH   Take these medicines the morning of surgery with A SIP OF WATER:METOPROLOL   .  Contacts, dentures or partial plates, or metal hairpins  can not be worn to surgery. Your family will be responsible for glasses, dentures, hearing aides while you are in surgery  Leave suitcase in the car. After surgery it may be brought to your room.  For patients admitted to the hospital, checkout time is 11:00 AM day of  discharge.                DO NOT WEAR JEWELRY, LOTIONS, POWDERS, OR PERFUMES.  WOMEN-- DO NOT SHAVE LEGS OR UNDERARMS FOR 48 HOURS BEFORE SHOWERS. MEN MAY SHAVE FACE.  Patients discharged the day of surgery will not be allowed to drive home. IF going home the day of surgery, you must have a driver and someone to stay with you for the first 24 hours  Name and phone number of your driver:     WIFE   lAURA Golinski                                                                                        FAILURE TO [place]                                                  Patient Signature _____________________________

## 2013-03-31 NOTE — Progress Notes (Signed)
03/31/13 1454  OBSTRUCTIVE SLEEP APNEA  Have you ever been diagnosed with sleep apnea through a sleep study? No  Do you snore loudly (loud enough to be heard through closed doors)?  0  Do you often feel tired, fatigued, or sleepy during the daytime? 0  Has anyone observed you stop breathing during your sleep? 0  Do you have, or are you being treated for high blood pressure? 1  BMI more than 35 kg/m2? 1  Age over 62 years old? 1  Neck circumference greater than 40 cm/18 inches? 0  Gender: 1  Obstructive Sleep Apnea Score 4  Score 4 or greater  Results sent to PCP

## 2013-04-01 NOTE — Progress Notes (Signed)
Left message 03/31/13, 04/01/13 with Evelena Asa at [facility] that patient states Dr Radford Pax is aware of surgery. I am unable to see a clearance note in EPIC if there was one required

## 2013-04-01 NOTE — Progress Notes (Signed)
Per Abigail Butts at  [facility] Dr Gladstone Lighter did not ask for any cardiac clearance

## 2013-04-07 ENCOUNTER — Encounter (HOSPITAL_COMMUNITY): Payer: Self-pay | Admitting: *Deleted

## 2013-04-07 ENCOUNTER — Ambulatory Visit (HOSPITAL_COMMUNITY)
Admission: RE | Admit: 2013-04-07 | Discharge: 2013-04-07 | Disposition: A | Discharge status: Home / Self Care | Payer: Worker's Compensation | Source: Ambulatory Visit | Attending: Orthopedic Surgery | Admitting: Orthopedic Surgery

## 2013-04-07 ENCOUNTER — Encounter (HOSPITAL_COMMUNITY): Payer: Worker's Compensation | Admitting: *Deleted

## 2013-04-07 ENCOUNTER — Encounter (HOSPITAL_COMMUNITY)
Admission: RE | Disposition: A | Discharge status: Home / Self Care | Payer: Self-pay | Source: Ambulatory Visit | Attending: Orthopedic Surgery

## 2013-04-07 ENCOUNTER — Ambulatory Visit (HOSPITAL_COMMUNITY): Payer: Worker's Compensation | Admitting: *Deleted

## 2013-04-07 DIAGNOSIS — I739 Peripheral vascular disease, unspecified: Secondary | ICD-10-CM | POA: Insufficient documentation

## 2013-04-07 DIAGNOSIS — Z79899 Other long term (current) drug therapy: Secondary | ICD-10-CM | POA: Insufficient documentation

## 2013-04-07 DIAGNOSIS — M1710 Unilateral primary osteoarthritis, unspecified knee: Secondary | ICD-10-CM | POA: Insufficient documentation

## 2013-04-07 DIAGNOSIS — N289 Disorder of kidney and ureter, unspecified: Secondary | ICD-10-CM | POA: Insufficient documentation

## 2013-04-07 DIAGNOSIS — Z905 Acquired absence of kidney: Secondary | ICD-10-CM | POA: Insufficient documentation

## 2013-04-07 DIAGNOSIS — M1712 Unilateral primary osteoarthritis, left knee: Secondary | ICD-10-CM | POA: Diagnosis present

## 2013-04-07 DIAGNOSIS — M2240 Chondromalacia patellae, unspecified knee: Secondary | ICD-10-CM | POA: Insufficient documentation

## 2013-04-07 DIAGNOSIS — E785 Hyperlipidemia, unspecified: Secondary | ICD-10-CM | POA: Insufficient documentation

## 2013-04-07 DIAGNOSIS — Z85528 Personal history of other malignant neoplasm of kidney: Secondary | ICD-10-CM | POA: Insufficient documentation

## 2013-04-07 DIAGNOSIS — Z87891 Personal history of nicotine dependence: Secondary | ICD-10-CM | POA: Insufficient documentation

## 2013-04-07 DIAGNOSIS — I10 Essential (primary) hypertension: Secondary | ICD-10-CM | POA: Insufficient documentation

## 2013-04-07 DIAGNOSIS — M23329 Other meniscus derangements, posterior horn of medial meniscus, unspecified knee: Secondary | ICD-10-CM | POA: Insufficient documentation

## 2013-04-07 DIAGNOSIS — I359 Nonrheumatic aortic valve disorder, unspecified: Secondary | ICD-10-CM | POA: Insufficient documentation

## 2013-04-07 HISTORY — PX: KNEE ARTHROSCOPY WITH MEDIAL MENISECTOMY: SHX5651

## 2013-04-07 SURGERY — ARTHROSCOPY, KNEE, WITH MEDIAL MENISCECTOMY
Anesthesia: General | Site: Knee | Laterality: Left

## 2013-04-07 MED ORDER — MEPERIDINE HCL 50 MG/ML IJ SOLN: 6.2500 mg | INTRAMUSCULAR | Status: DC | PRN

## 2013-04-07 MED ORDER — SODIUM CHLORIDE 0.9 % IJ SOLN
INTRAMUSCULAR | Status: AC
  Filled 2013-04-07: qty 20

## 2013-04-07 MED ORDER — BACITRACIN-NEOMYCIN-POLYMYXIN 400-5-5000 EX OINT
TOPICAL_OINTMENT | CUTANEOUS | Status: AC
  Filled 2013-04-07: qty 1

## 2013-04-07 MED ORDER — PROPOFOL 10 MG/ML IV BOLUS
INTRAVENOUS | Status: AC
  Filled 2013-04-07: qty 20

## 2013-04-07 MED ORDER — PHENYLEPHRINE HCL 10 MG/ML IJ SOLN
INTRAMUSCULAR | Status: DC | PRN
  Administered 2013-04-07: 80 ug via INTRAVENOUS
  Administered 2013-04-07: 120 ug via INTRAVENOUS

## 2013-04-07 MED ORDER — GLYCOPYRROLATE 0.2 MG/ML IJ SOLN
INTRAMUSCULAR | Status: DC | PRN
  Administered 2013-04-07 (×4): .05 mg via INTRAVENOUS

## 2013-04-07 MED ORDER — METOPROLOL TARTRATE 1 MG/ML IV SOLN
INTRAVENOUS | Status: AC
  Filled 2013-04-07: qty 5

## 2013-04-07 MED ORDER — HYDROMORPHONE HCL PF 1 MG/ML IJ SOLN
0.2500 mg | INTRAMUSCULAR | Status: DC | PRN
  Administered 2013-04-07 (×2): 0.5 mg via INTRAVENOUS

## 2013-04-07 MED ORDER — MIDAZOLAM HCL 2 MG/2ML IJ SOLN
INTRAMUSCULAR | Status: AC
  Filled 2013-04-07: qty 2

## 2013-04-07 MED ORDER — OXYCODONE HCL 5 MG/5ML PO SOLN
5.0000 mg | Freq: Once | ORAL | Status: AC | PRN
  Filled 2013-04-07: qty 5

## 2013-04-07 MED ORDER — LACTATED RINGERS IV SOLN
INTRAVENOUS | Status: DC
  Administered 2013-04-07: 14:00:00 via INTRAVENOUS
  Administered 2013-04-07: 1000 mL via INTRAVENOUS

## 2013-04-07 MED ORDER — METOCLOPRAMIDE HCL 5 MG/ML IJ SOLN
INTRAMUSCULAR | Status: DC | PRN
  Administered 2013-04-07: 10 mg via INTRAVENOUS

## 2013-04-07 MED ORDER — OXYCODONE HCL 5 MG PO TABS
5.0000 mg | ORAL_TABLET | Freq: Once | ORAL | Status: AC | PRN
  Administered 2013-04-07: 5 mg via ORAL
  Filled 2013-04-07: qty 1

## 2013-04-07 MED ORDER — KETAMINE HCL 10 MG/ML IJ SOLN
INTRAMUSCULAR | Status: AC
  Filled 2013-04-07: qty 1

## 2013-04-07 MED ORDER — BACITRACIN-NEOMYCIN-POLYMYXIN OINTMENT TUBE
TOPICAL_OINTMENT | CUTANEOUS | Status: DC | PRN
  Administered 2013-04-07: 1 via TOPICAL

## 2013-04-07 MED ORDER — HYDROMORPHONE HCL PF 1 MG/ML IJ SOLN
INTRAMUSCULAR | Status: AC
  Filled 2013-04-07: qty 1

## 2013-04-07 MED ORDER — CEFAZOLIN SODIUM-DEXTROSE 2-3 GM-% IV SOLR
2.0000 g | INTRAVENOUS | Status: AC
  Administered 2013-04-07: 2 g via INTRAVENOUS

## 2013-04-07 MED ORDER — ONDANSETRON HCL 4 MG/2ML IJ SOLN
INTRAMUSCULAR | Status: DC | PRN
  Administered 2013-04-07: 4 mg via INTRAVENOUS

## 2013-04-07 MED ORDER — FENTANYL CITRATE 0.05 MG/ML IJ SOLN
INTRAMUSCULAR | Status: DC | PRN
Start: 2013-04-07 — End: 2013-04-07
  Administered 2013-04-07: 100 ug via INTRAVENOUS
  Administered 2013-04-07: 50 ug via INTRAVENOUS

## 2013-04-07 MED ORDER — BUPIVACAINE-EPINEPHRINE 0.25% -1:200000 IJ SOLN
INTRAMUSCULAR | Status: DC | PRN
  Administered 2013-04-07: 30 mL

## 2013-04-07 MED ORDER — CEFAZOLIN SODIUM-DEXTROSE 2-3 GM-% IV SOLR
INTRAVENOUS | Status: AC
  Filled 2013-04-07: qty 50

## 2013-04-07 MED ORDER — EPHEDRINE SULFATE 50 MG/ML IJ SOLN
INTRAMUSCULAR | Status: DC | PRN
  Administered 2013-04-07: 10 mg via INTRAVENOUS
  Administered 2013-04-07: 15 mg via INTRAVENOUS

## 2013-04-07 MED ORDER — KETAMINE HCL 10 MG/ML IJ SOLN
INTRAMUSCULAR | Status: DC | PRN
  Administered 2013-04-07: 80 mg via INTRAVENOUS
  Administered 2013-04-07 (×2): 10 mg via INTRAVENOUS

## 2013-04-07 MED ORDER — BUPIVACAINE-EPINEPHRINE PF 0.25-1:200000 % IJ SOLN
INTRAMUSCULAR | Status: AC
  Filled 2013-04-07: qty 30

## 2013-04-07 MED ORDER — PROPOFOL 10 MG/ML IV BOLUS
INTRAVENOUS | Status: DC | PRN
  Administered 2013-04-07: 200 mg via INTRAVENOUS

## 2013-04-07 MED ORDER — MIDAZOLAM HCL 5 MG/5ML IJ SOLN
INTRAMUSCULAR | Status: DC | PRN
  Administered 2013-04-07: 2 mg via INTRAVENOUS

## 2013-04-07 MED ORDER — FENTANYL CITRATE 0.05 MG/ML IJ SOLN
INTRAMUSCULAR | Status: AC
  Filled 2013-04-07: qty 5

## 2013-04-07 MED ORDER — PROMETHAZINE HCL 25 MG/ML IJ SOLN: 6.2500 mg | INTRAMUSCULAR | Status: DC | PRN

## 2013-04-07 MED ORDER — DEXAMETHASONE SODIUM PHOSPHATE 4 MG/ML IJ SOLN
INTRAMUSCULAR | Status: DC | PRN
  Administered 2013-04-07: 10 mg via INTRAVENOUS

## 2013-04-07 MED ORDER — SODIUM CHLORIDE 0.9 % IR SOLN
Status: DC | PRN
  Administered 2013-04-07: 6000 mL

## 2013-04-07 MED ORDER — OXYCODONE-ACETAMINOPHEN 10-325 MG PO TABS: 1.0000 | ORAL_TABLET | ORAL | Status: DC | PRN

## 2013-04-07 SURGICAL SUPPLY — 24 items
BANDAGE ELASTIC 4 VELCRO ST LF (GAUZE/BANDAGES/DRESSINGS) ×3 IMPLANT
BLADE GREAT WHITE 4.2 (BLADE) IMPLANT
BLADE GREAT WHITE 4.2MM (BLADE)
BNDG COHESIVE 6X5 TAN STRL LF (GAUZE/BANDAGES/DRESSINGS) ×3 IMPLANT
DRAPE LG THREE QUARTER DISP (DRAPES) ×6 IMPLANT
DRSG ADAPTIC 3X8 NADH LF (GAUZE/BANDAGES/DRESSINGS) ×3 IMPLANT
DRSG PAD ABDOMINAL 8X10 ST (GAUZE/BANDAGES/DRESSINGS) ×6 IMPLANT
DURAPREP 26ML APPLICATOR (WOUND CARE) ×3 IMPLANT
GLOVE BIOGEL PI IND STRL 8 (GLOVE) ×1 IMPLANT
GLOVE BIOGEL PI INDICATOR 8 (GLOVE) ×2
GLOVE ECLIPSE 8.0 STRL XLNG CF (GLOVE) ×3 IMPLANT
GOWN STRL REUS W/TWL XL LVL3 (GOWN DISPOSABLE) ×3 IMPLANT
MANIFOLD NEPTUNE II (INSTRUMENTS) ×3 IMPLANT
PACK ARTHROSCOPY WL (CUSTOM PROCEDURE TRAY) ×3 IMPLANT
PACK ICE MAXI GEL EZY WRAP (MISCELLANEOUS) ×3 IMPLANT
PAD MASON LEG HOLDER (PIN) ×3 IMPLANT
SET ARTHROSCOPY TUBING (MISCELLANEOUS) ×2
SET ARTHROSCOPY TUBING LN (MISCELLANEOUS) ×1 IMPLANT
SUT ETHILON 3 0 PS 1 (SUTURE) ×3 IMPLANT
TOWEL OR 17X26 10 PK STRL BLUE (TOWEL DISPOSABLE) ×3 IMPLANT
TUBING CONNECTING 10 (TUBING) IMPLANT
TUBING CONNECTING 10' (TUBING)
WAND 90 DEG TURBOVAC W/CORD (SURGICAL WAND) ×3 IMPLANT
WRAP KNEE MAXI GEL POST OP (GAUZE/BANDAGES/DRESSINGS) ×3 IMPLANT

## 2013-04-07 NOTE — Transfer of Care (Signed)
Immediate Anesthesia Transfer of Care Note  Patient: Geoffrey [name]  Procedure(s) Performed: Procedure(s): LEFT KNEE ARTHROSCOPY WITH MEDIAL MENISCECTOMY ABRASION CHONDROPLASTY OF THE PATELLA  AND MEDIAL FEMORAL CONDYLE, MICROFRACTURE TECHNIQUE OF THE MEDIAL FEMORAL CONDYLE  (Left)  Patient Location: PACU  Anesthesia Type:General  Level of Consciousness: Patient easily awoken, sedated, comfortable, cooperative, following commands, responds to stimulation.   Airway & Oxygen Therapy: Patient spontaneously breathing, ventilating well, oxygen via simple oxygen mask.  Post-op Assessment: Report given to PACU RN, vital signs reviewed and stable, moving all extremities.   Post vital signs: Reviewed and stable.  Complications: No apparent anesthesia complications

## 2013-04-07 NOTE — Preoperative (Signed)
Beta Blockers   Reason not to administer Beta Blockers:Not Applicable, took BB this am 

## 2013-04-07 NOTE — Anesthesia Preprocedure Evaluation (Addendum)
Anesthesia Evaluation  Patient identified by MRN, date of birth, ID band Patient awake    Reviewed: Allergy & Precautions, H&P , NPO status , Patient's Chart, lab work & pertinent test results, reviewed documented beta blocker date and time   Airway Mallampati: II TM Distance: >3 FB Neck ROM: Full    Dental  (+) Dental Advisory Given   Pulmonary former smoker,  breath sounds clear to auscultation        Cardiovascular hypertension, Pt. on medications and Pt. on home beta blockers + Peripheral Vascular Disease + Valvular Problems/Murmurs AI Rhythm:Regular Rate:Normal  Echo 04/09/2012: Normal LVSF, moderate AI, mild TR   Neuro/Psych negative neurological ROS  negative psych ROS   GI/Hepatic Neg liver ROS, PUD,   Endo/Other  negative endocrine ROS  Renal/GU Renal disease     Musculoskeletal negative musculoskeletal ROS (+)   Abdominal   Peds  Hematology negative hematology ROS (+)   Anesthesia Other Findings   Reproductive/Obstetrics                        Anesthesia Physical Anesthesia Plan  ASA: II  Anesthesia Plan: General   Post-op Pain Management:    Induction: Intravenous  Airway Management Planned: LMA  Additional Equipment:   Intra-op Plan:   Post-operative Plan: Extubation in OR  Informed Consent: I have reviewed the patients History and Physical, chart, labs and discussed the procedure including the risks, benefits and alternatives for the proposed anesthesia with the patient or authorized representative who has indicated his/her understanding and acceptance.   Dental advisory given  Plan Discussed with: CRNA  Anesthesia Plan Comments:         Anesthesia Quick Evaluation

## 2013-04-07 NOTE — Interval H&P Note (Signed)
History and Physical Interval Note:  04/07/2013 1:26 PM  Geoffrey [name]  has presented today for surgery, with the diagnosis of left knee medial meniscal tear  The various methods of treatment have been discussed with the patient and family. After consideration of risks, benefits and other options for treatment, the patient has consented to  Procedure(s): LEFT KNEE ARTHROSCOPY WITH MEDIAL MENISCECTOMY (Left) as a surgical intervention .  The patient's history has been reviewed, patient examined, no change in status, stable for surgery.  I have reviewed the patient's chart and labs.  Questions were answered to the patient's satisfaction.     Vencil Basnett A

## 2013-04-07 NOTE — Anesthesia Postprocedure Evaluation (Signed)
Anesthesia Post Note  Patient: Geoffrey [name]  Procedure(s) Performed: Procedure(s) (LRB): LEFT KNEE ARTHROSCOPY WITH MEDIAL MENISCECTOMY ABRASION CHONDROPLASTY OF THE PATELLA  AND MEDIAL FEMORAL CONDYLE, MICROFRACTURE TECHNIQUE OF THE MEDIAL FEMORAL CONDYLE  (Left)  Anesthesia type: General  Patient location: PACU  Post pain: Pain level controlled  Post assessment: Post-op Vital signs reviewed  Last Vitals: BP 154/77  Pulse 68  Temp(Src) 36.6 C (Oral)  Resp 16  SpO2 100%  Post vital signs: Reviewed  Level of consciousness: sedated  Complications: No apparent anesthesia complications

## 2013-04-07 NOTE — Brief Op Note (Signed)
04/07/2013  2:35 PM  PATIENT:  Geoffrey [name]  63 y.o. male  PRE-OPERATIVE DIAGNOSIS:  left knee medial meniscal tear  POST-OPERATIVE DIAGNOSIS:  left knee medial meniscal tear  PROCEDURE:  Procedure(s): LEFT KNEE ARTHROSCOPY WITH MEDIAL MENISCECTOMY ABRASION CHONDROPLASTY OF THE PATELLA  AND MEDIAL FEMORAL CONDYLE, MICROFRACTURE TECHNIQUE OF THE MEDIAL FEMORAL CONDYLE  (Left)  SURGEON:  Surgeon(s) and Role:    * Tobi Bastos, MD - Primary    ASSISTANTS: OR Tech  ANESTHESIA:   general  EBL:  Total I/O In: 1000 [I.V.:1000] Out: -   BLOOD ADMINISTERED:none  DRAINS: none   LOCAL MEDICATIONS USED:  MARCAINE 30cc with Epinephrine    SPECIMEN:  No Specimen  DISPOSITION OF SPECIMEN:  N/A  COUNTS:  YES  TOURNIQUET:  * No tourniquets in log *  DICTATION: .Other Dictation: Dictation Number [id number]  PLAN OF CARE: Discharge to home after PACU  PATIENT DISPOSITION:  PACU - hemodynamically stable.   Delay start of Pharmacological VTE agent (>24hrs) due to surgical blood loss or risk of bleeding: yes

## 2013-04-07 NOTE — H&P (Signed)
Geoffrey [name] is an 63 y.o. male.   Chief Complaint: Pain and swelling of Left Knee HPI: Pain and popp[ing of Left knee,  Past Medical History  Diagnosis Date  . Aortic valve disorders   . Hyperkalemia   . Dyslipidemia   . Gastric ulcer   . Renal cell cancer   . PAC (premature atrial contraction)   . Dilated aortic root   . HTN (hypertension)     Past Surgical History  Procedure Laterality Date  . Cataract extraction    . Kidney surgery Left     partial nephrectomy  . Knee surgery    . Back surgery      lumbar  . Tonsillectomy      Family History  Problem Relation Age of Onset  . Hypertension Father   . Dementia Mother   . Hypertension Sister   . Breast cancer Sister    Social History:  reports that he quit smoking about 35 years ago. He has never used smokeless tobacco. He reports that he drinks alcohol. He reports that he does not use illicit drugs.  Allergies:  Allergies  Allergen Reactions  . Zetia [Ezetimibe]     rash  . Shrimp [Shellfish Allergy] Rash    Medications Prior to Admission  Medication Sig Dispense Refill  . amLODipine-olmesartan (AZOR) 10-40 MG per tablet Take 1 tablet by mouth every evening.       Marland Kitchen CRESTOR 40 MG tablet Take 40 mg by mouth every evening.       . metoprolol tartrate (LOPRESSOR) 25 MG tablet Take 12.5 mg by mouth 2 (two) times daily.      Marland Kitchen aspirin 81 MG tablet Take 81 mg by mouth every evening.       . Omega-3 Fatty Acids (FISH OIL PO) Take 1,000 mg by mouth 2 (two) times daily.         No results found for this or any previous visit (from the past 48 hour(s)). No results found.  Review of Systems  Constitutional: Negative.   HENT: Negative.   Eyes: Negative.   Respiratory: Negative.   Cardiovascular: Negative.   Gastrointestinal: Negative.   Genitourinary: Negative.   Musculoskeletal: Positive for joint pain.  Skin: Negative.   Neurological: Negative.   Endo/Heme/Allergies: Negative.     Blood pressure 143/88,  pulse 63, temperature 97.3 F (36.3 C), temperature source Oral, resp. rate 16, SpO2 98.00%. Physical Exam  Constitutional: He appears well-developed.  HENT:  Head: Normocephalic.  Eyes: Pupils are equal, round, and reactive to light.  Cardiovascular: Normal rate.   Respiratory: Effort normal.  GI: Soft.  Musculoskeletal:  Pain and swelling of Left Knee.  Neurological: He is alert.  Skin: Skin is warm.     Assessment/Plan Left Knee Arthroscopic Medial Meniscectomy   Geoffrey [name] A 04/07/2013, 1:23 PM

## 2013-04-08 ENCOUNTER — Encounter (HOSPITAL_COMMUNITY): Payer: Self-pay | Admitting: Orthopedic Surgery

## 2013-04-08 NOTE — Op Note (Signed)
[name], PREIS NO.:  [id number]  MEDICAL RECORD NO.:  [id number]  LOCATION:  [facility]                         FACILITY:  [facility]  PHYSICIAN:  Kipp Brood. Maiko Salais, M.D.DATE OF BIRTH:  [birth year]  DATE OF PROCEDURE:  04/07/2013 DATE OF DISCHARGE:  04/07/2013                              OPERATIVE REPORT   SURGEON:  Jori Moll A. Ashland Wiseman, M.D.  ASSISTANT:  OR tech.  PREOPERATIVE DIAGNOSES: 1. Osteoarthritis, left knee. 2. Retear of the medial meniscus, left knee.  POSTOPERATIVE DIAGNOSES: 1. Osteoarthritis, left knee. 2. Retear of the medial meniscus, left knee.  OPERATION: 1. Diagnostic arthroscopy, left knee. 2. Abrasion chondroplasty patella, left knee. 3. Abrasion chondroplasty, medial femoral condyle, left knee. 4. Partial medial meniscectomy, posterior horn, left knee. 5. Microfracture technique, medial femoral condyle, left knee.  PROCEDURE:  Under general anesthesia, routine orthopedic prepping and draping of the left lower extremity was carried out.  The appropriate time-out was first carried out.  I also marked the appropriate left leg in the holding area.  At this time, the patient had 2 g IV Ancef.  The small punctate incision was made in suprapatellar pouch, with the knee in the knee holder.  The inflow cannula was inserted.  Knee was distended with saline.  Another small punctate incision made in the anterolateral aspect of the left knee bleeding.  At this time, the arthroscope was entered from the lateral approach and I had a complete diagnostic arthroscopy of the left knee.  I went up into the suprapatellar pouch.  He had chondromalacia of the patellofemoral joint. I utilized the technique for abrasion chondroplasty, not down the bleeding bone here.  The synovium was fine.  I went down the lateral joint he had early arthritic changes.  The cruciates were intact.  The medial joint was highlighted the main problem.  I identified the remaining  portion of posterior horn of the medial meniscus, completed the partial meniscectomy.  I then examined the femoral condyles.  The medial femoral condyle had significant chondromalacia changes.  I did an abrasion chondroplasty down the bleeding bone of the medial femoral condyle.  I then went on and did a microfracture technique of the medial femoral condyle in usual fashion.  I then went back and removed all loose pieces of cartilage from the femoral condyle.  We thoroughly irrigated out the knee, removed the fluid, closed all 3 punctate incisions with 3-0 nylon suture.  I injected 30 mL of 0.25% Marcaine with epinephrine in the knee joint.  Sterile Neosporin, [place] dressings were applied.  1. Postop, he is going to be placed on aspirin 325 mg twice a day     starting today twice a day for 2 weeks as an anticoagulant. 2. He will be on Percocet 10/325, 1 every 3 hours p.r.n. for pain. 3. He will be on crutches, partial weightbearing as tolerated. 4. He will see me in the office 10-12 days or prior to that if there     is any problem.          ______________________________ Kipp Brood Gladstone Lighter, M.D.     RAG/MEDQ  D:  04/07/2013  T:  04/08/2013  Job:  [id number]

## 2013-04-11 ENCOUNTER — Other Ambulatory Visit: Payer: Self-pay | Admitting: General Surgery

## 2013-04-11 DIAGNOSIS — I359 Nonrheumatic aortic valve disorder, unspecified: Secondary | ICD-10-CM

## 2013-04-12 ENCOUNTER — Encounter: Payer: Self-pay | Admitting: Cardiology

## 2013-04-12 ENCOUNTER — Ambulatory Visit (HOSPITAL_COMMUNITY): Payer: BC Managed Care – PPO | Attending: Cardiology | Admitting: Radiology

## 2013-04-12 DIAGNOSIS — I079 Rheumatic tricuspid valve disease, unspecified: Secondary | ICD-10-CM | POA: Insufficient documentation

## 2013-04-12 DIAGNOSIS — Z87891 Personal history of nicotine dependence: Secondary | ICD-10-CM | POA: Insufficient documentation

## 2013-04-12 DIAGNOSIS — I359 Nonrheumatic aortic valve disorder, unspecified: Secondary | ICD-10-CM

## 2013-04-12 DIAGNOSIS — I10 Essential (primary) hypertension: Secondary | ICD-10-CM | POA: Insufficient documentation

## 2013-04-12 NOTE — Progress Notes (Signed)
Echocardiogram performed.  

## 2013-04-27 ENCOUNTER — Other Ambulatory Visit: Payer: Self-pay

## 2013-04-27 MED ORDER — ROSUVASTATIN CALCIUM 40 MG PO TABS: 40.0000 mg | ORAL_TABLET | Freq: Every evening | ORAL | Status: DC

## 2013-06-02 ENCOUNTER — Other Ambulatory Visit (HOSPITAL_COMMUNITY): Payer: Self-pay | Admitting: Urology

## 2013-06-02 ENCOUNTER — Ambulatory Visit (HOSPITAL_COMMUNITY)
Admission: RE | Admit: 2013-06-02 | Discharge: 2013-06-02 | Disposition: A | Discharge status: Home / Self Care | Payer: BC Managed Care – PPO | Source: Ambulatory Visit | Attending: Urology | Admitting: Urology

## 2013-06-02 DIAGNOSIS — C649 Malignant neoplasm of unspecified kidney, except renal pelvis: Secondary | ICD-10-CM

## 2013-06-02 DIAGNOSIS — Z85528 Personal history of other malignant neoplasm of kidney: Secondary | ICD-10-CM | POA: Insufficient documentation

## 2013-06-24 ENCOUNTER — Other Ambulatory Visit: Payer: Self-pay | Admitting: *Deleted

## 2013-06-24 MED ORDER — AMLODIPINE-OLMESARTAN 10-40 MG PO TABS: 1.0000 | ORAL_TABLET | Freq: Every evening | ORAL | Status: DC

## 2013-07-28 ENCOUNTER — Other Ambulatory Visit: Payer: Self-pay | Admitting: Urology

## 2013-07-28 ENCOUNTER — Encounter (HOSPITAL_COMMUNITY): Payer: Self-pay | Admitting: Pharmacy Technician

## 2013-07-28 ENCOUNTER — Other Ambulatory Visit (HOSPITAL_COMMUNITY): Payer: Self-pay | Admitting: Anesthesiology

## 2013-07-28 NOTE — Progress Notes (Signed)
Chest x ray 5/15, ekg with LOv Dr Radford Pax 2/15, eccho 3/15  EPIC

## 2013-07-28 NOTE — Patient Instructions (Addendum)
Your procedure is scheduled on:  08/04/13  THURSDAY  Report to [facility] at  [place]     AM.   Call this number if you have problems the morning of surgery: [phone]        Do not eat food  Or drink :After Midnight. Wednesday NIGHT   Take these medicines the morning of surgery with A SIP OF WATER: METOPROLOL MAY TAKE OXYCODONE IF NEEDED   .  Contacts, dentures or partial plates, or metal hairpins  can not be worn to surgery. Your family will be responsible for glasses, dentures, hearing aides while you are in surgery  Leave suitcase in the car. After surgery it may be brought to your room.  For patients admitted to the hospital, checkout time is 11:00 AM day of  discharge.         [employer] IS NOT RESPONSIBLE FOR ANY VALUABLES  Patients discharged the day of surgery will not be allowed to drive home. IF going home the day of surgery, you must have a driver and someone to stay with you for the first 24 hours  Name and phone number of your driver:    Wife  [facility] - Preparing for Surgery Before surgery, you can play an important role.  Because skin is not sterile, your skin needs to be as free of germs as possible.  You can reduce the number of germs on your skin by washing with CHG (chlorahexidine gluconate) soap before surgery.  CHG is an antiseptic cleaner which kills germs and bonds with the skin to continue killing germs even after washing. Please DO NOT use if you have an allergy to CHG or antibacterial soaps.  If your skin becomes reddened/irritated stop using the CHG and inform your nurse when you arrive at Short Stay. Do not shave (including legs and underarms) for at least 48 hours prior to the first CHG shower.  You may shave your face/neck. Please follow  these instructions carefully:  1.  Shower with CHG Soap the night before surgery and the  morning of Surgery.  2.  If you choose to wash your hair, wash your hair first as usual with your  normal  shampoo.  3.  After you shampoo, rinse your hair and body thoroughly to remove the  shampoo.                           4.  Use CHG as you would any other liquid soap.  You can apply chg directly  to the skin and wash  Gently with a scrungie or clean washcloth.  5.  Apply the CHG Soap to your body ONLY FROM THE NECK DOWN.   Do not use on face/ open                           Wound or open sores. Avoid contact with eyes, ears mouth and genitals (private parts).                       Wash face,  Genitals (private parts) with your normal soap.             6.  Wash thoroughly, paying special attention to the area where your surgery  will be performed.  7.  Thoroughly rinse your body with warm water from the neck down.  8.  DO NOT shower/wash with your normal soap after using and rinsing off  the CHG Soap.                9.  Pat yourself dry with a clean towel.            10.  Wear clean pajamas.            11.  Place clean sheets on your bed the night of your first shower and do not  sleep with pets. Day of Surgery : Do not apply any lotions/deodorants the morning of surgery.  Please wear clean clothes to the hospital/surgery center.  FAILURE TO FOLLOW THESE INSTRUCTIONS MAY RESULT IN THE CANCELLATION OF YOUR SURGERY PATIENT SIGNATURE_________________________________  NURSE SIGNATURE__________________________________  ________________________________________________________________________

## 2013-07-29 ENCOUNTER — Encounter (HOSPITAL_COMMUNITY): Payer: Self-pay

## 2013-07-29 ENCOUNTER — Encounter (HOSPITAL_COMMUNITY)
Admission: RE | Admit: 2013-07-29 | Discharge: 2013-07-29 | Disposition: A | Discharge status: Home / Self Care | Payer: BC Managed Care – PPO | Source: Ambulatory Visit | Attending: Urology | Admitting: Urology

## 2013-07-29 ENCOUNTER — Encounter (INDEPENDENT_AMBULATORY_CARE_PROVIDER_SITE_OTHER): Payer: Self-pay

## 2013-07-29 ENCOUNTER — Other Ambulatory Visit: Payer: Self-pay

## 2013-07-29 DIAGNOSIS — Z01812 Encounter for preprocedural laboratory examination: Secondary | ICD-10-CM | POA: Insufficient documentation

## 2013-07-29 DIAGNOSIS — Z01818 Encounter for other preprocedural examination: Secondary | ICD-10-CM | POA: Insufficient documentation

## 2013-07-29 HISTORY — DX: Calculus of kidney: N20.0

## 2013-07-29 HISTORY — DX: Unspecified osteoarthritis, unspecified site: M19.90

## 2013-07-29 HISTORY — DX: Cardiac murmur, unspecified: R01.1

## 2013-07-29 LAB — BASIC METABOLIC PANEL
Anion gap: 12 (ref 5–15)
BUN: 16 mg/dL (ref 6–23)
CHLORIDE: 103 meq/L (ref 96–112)
CO2: 26 mEq/L (ref 19–32)
Calcium: 9.2 mg/dL (ref 8.4–10.5)
Creatinine, Ser: 1.13 mg/dL (ref 0.50–1.35)
GFR calc Af Amer: 79 mL/min — ABNORMAL LOW (ref >90)
GFR calc non Af Amer: 68 mL/min — ABNORMAL LOW (ref >90)
GLUCOSE: 101 mg/dL — AB (ref 70–99)
POTASSIUM: 4.8 meq/L (ref 3.7–5.3)
Sodium: 141 mEq/L (ref 137–147)

## 2013-07-29 LAB — CBC
HEMATOCRIT: 41.5 % (ref 39.0–52.0)
HEMOGLOBIN: 13.4 g/dL (ref 13.0–17.0)
MCH: 28.8 pg (ref 26.0–34.0)
MCHC: 32.3 g/dL (ref 30.0–36.0)
MCV: 89.1 fL (ref 78.0–100.0)
Platelets: 238 10*3/uL (ref 150–400)
RBC: 4.66 MIL/uL (ref 4.22–5.81)
RDW: 14.3 % (ref 11.5–15.5)
WBC: 7.2 10*3/uL (ref 4.0–10.5)

## 2013-07-29 MED ORDER — METOPROLOL TARTRATE 25 MG PO TABS: 12.5000 mg | ORAL_TABLET | Freq: Two times a day (BID) | ORAL | Status: DC

## 2013-07-29 NOTE — Progress Notes (Signed)
07/29/13 0908  OBSTRUCTIVE SLEEP APNEA  Have you ever been diagnosed with sleep apnea through a sleep study? No  Do you snore loudly (loud enough to be heard through closed doors)?  1  Do you often feel tired, fatigued, or sleepy during the daytime? 0  Has anyone observed you stop breathing during your sleep? 0  Do you have, or are you being treated for high blood pressure? 1  BMI more than 35 kg/m2? 1  Age over 62 years old? 1  Neck circumference greater than 40 cm/16 inches? 1  Gender: 1  Obstructive Sleep Apnea Score 6  Score 4 or greater  Results sent to PCP

## 2013-08-03 NOTE — Anesthesia Preprocedure Evaluation (Addendum)
Anesthesia Evaluation  Patient identified by MRN, date of birth, ID band Patient awake    Reviewed: Allergy & Precautions, H&P , NPO status , Patient's Chart, lab work & pertinent test results, reviewed documented beta blocker date and time   Airway Mallampati: III TM Distance: >3 FB Neck ROM: Full    Dental  (+) Dental Advisory Given   Pulmonary former smoker,  breath sounds clear to auscultation        Cardiovascular hypertension, Pt. on medications and Pt. on home beta blockers + Peripheral Vascular Disease + Valvular Problems/Murmurs AI Rhythm:Regular Rate:Normal  Echo 04/09/2012: Normal LVSF, moderate AI, mild TR   Neuro/Psych negative neurological ROS  negative psych ROS   GI/Hepatic Neg liver ROS, PUD,   Endo/Other  negative endocrine ROSMorbid obesity  Renal/GU Renal disease     Musculoskeletal negative musculoskeletal ROS (+)   Abdominal   Peds  Hematology negative hematology ROS (+)   Anesthesia Other Findings   Reproductive/Obstetrics                         Anesthesia Physical Anesthesia Plan  ASA: III  Anesthesia Plan: General   Post-op Pain Management:    Induction: Intravenous  Airway Management Planned: LMA  Additional Equipment:   Intra-op Plan:   Post-operative Plan: Extubation in OR  Informed Consent: I have reviewed the patients History and Physical, chart, labs and discussed the procedure including the risks, benefits and alternatives for the proposed anesthesia with the patient or authorized representative who has indicated his/her understanding and acceptance.   Dental advisory given  Plan Discussed with: CRNA  Anesthesia Plan Comments:        Anesthesia Quick Evaluation

## 2013-08-03 NOTE — H&P (Signed)
History of Present Illness Geoffrey [name] is a 63 year old with the following urologic history:    1) Renal cell carcinoma: He is s/p a left partial nephrectomy on May 18, 2008.     TNM stage: pT3a Nx Mx  Histology: Chromophobe  Fuhrman grade: III/IV  Surgical margins: Negative    2) Urolithiasis: He was incidentally noted to have an asymptomatic left ureteral calculus on surveillance CT imaging in May 2015 for his kidney cancer. His clinical situation was consistent with a uric acid stone and he was treated with pH manipulation.    Interval history:    He follows up today for further evaluation of his left ureteral calculus. He continues to remain mostly asymptomatic although did have a few days where he had left groin pain with radiation to his left testis. He has been very compliant taking his potassiums citrate and has been supplementing with lemonade. He has been unable to void today.   Past Medical History Problems  1. History of Aortic Regurgitation (424.1) 2. History of Bicuspid Aortic Valve (746.4) 3. History of Cardiomyopathy (425.4) 4. History of Gastric ulcer (531.90) 5. History of hypercholesterolemia (V12.29) 6. History of hypertension (V12.59) 7. Renal cell carcinoma, unspecified laterality (189.0)  Surgical History Problems  1. History of Cataract Surgery 2. History of Partial Nephrectomy 3. History of Tonsillectomy  Current Meds 1. Aspirin 81 MG Oral Tablet;  Therapy: (Recorded:26Mar2010) to Recorded 2. Astepro 0.15 % Nasal Solution;  Therapy: [id number] to Recorded 3. Azor 10-40 MG Oral Tablet;  Therapy: (Recorded:26Mar2010) to Recorded 4. CloNIDine HCl - 0.1 MG/24HR Transdermal Patch Weekly;  Therapy: 27Jan2011 to Recorded 5. Crestor 40 MG Oral Tablet;  Therapy: (Recorded:26Mar2010) to Recorded 6. Hydrochlorothiazide 12.5 MG Oral Capsule;  Therapy: [id number] to Recorded 7. Meloxicam 7.5 MG Oral Tablet;  Therapy: 10UVO5366 to Recorded 8.  Metoprolol Succinate ER 25 MG Oral Tablet Extended Release 24 Hour;  Therapy: (Recorded:30May2013) to Recorded 9. Potassium Citrate ER 10 MEQ (1080 MG) Oral Tablet Extended Release; TAKE 2  TABLETS BY MOUTH 3TIMES DAILY;  Therapy: [id number] to (Last Rx:28May2015)  Requested for: 95GLO7564 Ordered 10. Tamsulosin HCl - 0.4 MG Oral Capsule; TAKE 1 CAPSULE Bedtime;   Therapy: 33IRJ1884 to (Last Rx:28May2015)  Requested for: 16SAY3016 Ordered  Allergies Medication  1. No Known Drug Allergies  Family History Problems  1. Family history of Diabetes Mellitus (V18.0) : Father 2. Family history of Hypertension (V17.49) : Father 3. Denied: Family history of Kidney Cancer 4. Family history of Lung Cancer (V16.1) : Father  Social History Problems  1. Alcohol Use   avg two per week 2. Former smoker (V15.82) 3. Marital History - Currently Married 4. Occupation:   mail carrier 5. Tobacco Use (V15.82)   Two packs per day for 15 years. Quit around 1995.  Review of Systems  Genitourinary: no hematuria.  Gastrointestinal: no nausea and no vomiting.  Constitutional: no fever.    Vitals Vital Signs [Data Includes: Last 1 Day]  Recorded: 01UXN2355 03:50PM  Height: 5 ft 9 in Weight: 230 lb  BMI Calculated: 33.97 BSA Calculated: 2.19 Blood Pressure: 158 / 70 Heart Rate: 49  Physical Exam Constitutional: Well nourished and well developed . No acute distress.  Pulmonary: No respiratory distress and normal respiratory rhythm and effort.  Cardiovascular: Heart rate and rhythm are normal . No peripheral edema.  Abdomen: Incision site(s) well healed. No CVA tenderness.  Skin: Normal skin turgor, no visible rash and no visible skin lesions.  Neuro/Psych:. Mood and  affect are appropriate.    Results/Data Selected Results  CT-PELVIS W/O CONTRAST [id number] 12:00AM Raynelle Bring   Test Name Result Flag Reference  CT-PELVIS W/O CONTRAST (Report)    ** RADIOLOGY REPORT BY [facility] **   CLINICAL DATA: Renal cell carcinoma, partial left nephrectomy. Left ureteral stone  EXAM: CT PELVIS WITHOUT CONTRAST  TECHNIQUE: Multidetector CT imaging of the pelvis was performed following the standard protocol without intravenous contrast.  COMPARISON: CT 06/03/2013.  FINDINGS: The ureteral calculus measuring 4 mm in the distal left ureter is not changed position significantly from comparison exam and is approximately 1 cm from the vesicoureteral junction (image 37, series 2). No additional ureteral calculi are noted. No bladder calculi.  Prostate gland and seminal vesicles appear normal. The bladder appears normal. There is no pelvic or retroperitoneal lymphadenopathy. The fat filled right inguinal hernia noted. No aggressive osseous lesion. Posterior lumbar fusion noted.  IMPRESSION: No significant migration of the distal left ureteral calculus.   Electronically Signed  By: Suzy Bouchard M.D.  On: 07/26/2013 14:04    I independently reviewed a CT scan of the pelvis which reveals a persistent distal left ureteral calculus measuring 3-4 mm.  Assessment Assessed  1. Renal cell carcinoma, unspecified laterality (189.0) 2. Ureteral stone (592.1)  Plan Ureteral stone  1. Follow-up Office  Follow-up - Will call to schedule surgery  Status: Complete  Done:  [id number]  Discussion/Summary 1. Left ureteral calculus: His stone unfortunately has not passed and has not resolved with medical therapy. He is now very symptomatic and I did recommend we proceed with definitive therapy considering its persistence. We reviewed options and I recommended he proceed with ureteroscopic laser lithotripsy for stone removal. We reviewed the potential risks, complications, alternative options, and the expected recovery process. Informed consent was obtained. This will be performed in the near future.    Cc: Dr. [name]     Signatures Electronically signed by  : Raynelle Bring, M.D.; Jul 27 2013  5:30PM EST

## 2013-08-04 ENCOUNTER — Ambulatory Visit (HOSPITAL_COMMUNITY)
Admission: RE | Admit: 2013-08-04 | Discharge: 2013-08-04 | Disposition: A | Discharge status: Home / Self Care | Payer: BC Managed Care – PPO | Source: Ambulatory Visit | Attending: Urology | Admitting: Urology

## 2013-08-04 ENCOUNTER — Encounter (HOSPITAL_COMMUNITY)
Admission: RE | Disposition: A | Discharge status: Home / Self Care | Payer: Self-pay | Source: Ambulatory Visit | Attending: Urology

## 2013-08-04 ENCOUNTER — Encounter (HOSPITAL_COMMUNITY): Payer: Self-pay | Admitting: *Deleted

## 2013-08-04 ENCOUNTER — Encounter (HOSPITAL_COMMUNITY): Payer: BC Managed Care – PPO | Admitting: Anesthesiology

## 2013-08-04 ENCOUNTER — Ambulatory Visit (HOSPITAL_COMMUNITY): Payer: BC Managed Care – PPO | Admitting: Anesthesiology

## 2013-08-04 DIAGNOSIS — Z905 Acquired absence of kidney: Secondary | ICD-10-CM | POA: Insufficient documentation

## 2013-08-04 DIAGNOSIS — Z6833 Body mass index (BMI) 33.0-33.9, adult: Secondary | ICD-10-CM | POA: Insufficient documentation

## 2013-08-04 DIAGNOSIS — Z8711 Personal history of peptic ulcer disease: Secondary | ICD-10-CM | POA: Insufficient documentation

## 2013-08-04 DIAGNOSIS — Z87891 Personal history of nicotine dependence: Secondary | ICD-10-CM | POA: Insufficient documentation

## 2013-08-04 DIAGNOSIS — R011 Cardiac murmur, unspecified: Secondary | ICD-10-CM | POA: Insufficient documentation

## 2013-08-04 DIAGNOSIS — I10 Essential (primary) hypertension: Secondary | ICD-10-CM | POA: Insufficient documentation

## 2013-08-04 DIAGNOSIS — N201 Calculus of ureter: Secondary | ICD-10-CM | POA: Insufficient documentation

## 2013-08-04 DIAGNOSIS — Z85528 Personal history of other malignant neoplasm of kidney: Secondary | ICD-10-CM | POA: Insufficient documentation

## 2013-08-04 DIAGNOSIS — Z79899 Other long term (current) drug therapy: Secondary | ICD-10-CM | POA: Insufficient documentation

## 2013-08-04 DIAGNOSIS — I739 Peripheral vascular disease, unspecified: Secondary | ICD-10-CM | POA: Insufficient documentation

## 2013-08-04 HISTORY — PX: HOLMIUM LASER APPLICATION: SHX5852

## 2013-08-04 HISTORY — PX: CYSTOSCOPY WITH RETROGRADE PYELOGRAM, URETEROSCOPY AND STENT PLACEMENT: SHX5789

## 2013-08-04 SURGERY — CYSTOURETEROSCOPY, WITH RETROGRADE PYELOGRAM AND STENT INSERTION
Anesthesia: General | Laterality: Left

## 2013-08-04 MED ORDER — 0.9 % SODIUM CHLORIDE (POUR BTL) OPTIME
TOPICAL | Status: DC | PRN
  Administered 2013-08-04: 1000 mL

## 2013-08-04 MED ORDER — FENTANYL CITRATE 0.05 MG/ML IJ SOLN
INTRAMUSCULAR | Status: DC | PRN
  Administered 2013-08-04 (×4): 25 ug via INTRAVENOUS
  Administered 2013-08-04: 50 ug via INTRAVENOUS

## 2013-08-04 MED ORDER — MIDAZOLAM HCL 2 MG/2ML IJ SOLN
INTRAMUSCULAR | Status: AC
  Filled 2013-08-04: qty 2

## 2013-08-04 MED ORDER — EPHEDRINE SULFATE 50 MG/ML IJ SOLN
INTRAMUSCULAR | Status: DC | PRN
  Administered 2013-08-04 (×2): 5 mg via INTRAVENOUS

## 2013-08-04 MED ORDER — LACTATED RINGERS IV SOLN: INTRAVENOUS | Status: DC

## 2013-08-04 MED ORDER — ONDANSETRON HCL 4 MG/2ML IJ SOLN
INTRAMUSCULAR | Status: AC
  Filled 2013-08-04: qty 2

## 2013-08-04 MED ORDER — DEXAMETHASONE SODIUM PHOSPHATE 10 MG/ML IJ SOLN
INTRAMUSCULAR | Status: DC | PRN
  Administered 2013-08-04: 10 mg via INTRAVENOUS

## 2013-08-04 MED ORDER — OXYCODONE-ACETAMINOPHEN 5-325 MG PO TABS
1.0000 | ORAL_TABLET | ORAL | Status: AC | PRN
  Administered 2013-08-04 (×2): 1 via ORAL
  Filled 2013-08-04 (×2): qty 1

## 2013-08-04 MED ORDER — FENTANYL CITRATE 0.05 MG/ML IJ SOLN
INTRAMUSCULAR | Status: AC
  Filled 2013-08-04: qty 5

## 2013-08-04 MED ORDER — MIDAZOLAM HCL 5 MG/5ML IJ SOLN
INTRAMUSCULAR | Status: DC | PRN
  Administered 2013-08-04: 2 mg via INTRAVENOUS

## 2013-08-04 MED ORDER — CIPROFLOXACIN IN D5W 400 MG/200ML IV SOLN
400.0000 mg | INTRAVENOUS | Status: AC
  Administered 2013-08-04: 400 mg via INTRAVENOUS

## 2013-08-04 MED ORDER — OXYCODONE-ACETAMINOPHEN 5-325 MG PO TABS: 1.0000 | ORAL_TABLET | ORAL | Status: DC | PRN

## 2013-08-04 MED ORDER — HYDROMORPHONE HCL PF 1 MG/ML IJ SOLN
INTRAMUSCULAR | Status: AC
  Filled 2013-08-04: qty 1

## 2013-08-04 MED ORDER — HYDROMORPHONE HCL PF 1 MG/ML IJ SOLN
0.2500 mg | INTRAMUSCULAR | Status: DC | PRN
  Administered 2013-08-04 (×2): 0.5 mg via INTRAVENOUS

## 2013-08-04 MED ORDER — EPHEDRINE SULFATE 50 MG/ML IJ SOLN
INTRAMUSCULAR | Status: AC
  Filled 2013-08-04: qty 1

## 2013-08-04 MED ORDER — ONDANSETRON HCL 4 MG/2ML IJ SOLN
INTRAMUSCULAR | Status: DC | PRN
  Administered 2013-08-04: 4 mg via INTRAVENOUS

## 2013-08-04 MED ORDER — LACTATED RINGERS IV SOLN
INTRAVENOUS | Status: DC | PRN
  Administered 2013-08-04: 07:00:00 via INTRAVENOUS

## 2013-08-04 MED ORDER — CIPROFLOXACIN IN D5W 400 MG/200ML IV SOLN
INTRAVENOUS | Status: AC
  Filled 2013-08-04: qty 200

## 2013-08-04 MED ORDER — SODIUM CHLORIDE 0.9 % IR SOLN
Status: DC | PRN
  Administered 2013-08-04: 3000 mL

## 2013-08-04 MED ORDER — PROPOFOL 10 MG/ML IV BOLUS
INTRAVENOUS | Status: AC
Start: 2013-08-04 — End: 2013-08-04
  Filled 2013-08-04: qty 20

## 2013-08-04 MED ORDER — IOHEXOL 300 MG/ML  SOLN
INTRAMUSCULAR | Status: DC | PRN
  Administered 2013-08-04: 2 mL

## 2013-08-04 MED ORDER — SODIUM CHLORIDE 0.9 % IR SOLN
Status: DC | PRN
  Administered 2013-08-04: 1000 mL

## 2013-08-04 MED ORDER — SODIUM CHLORIDE 0.9 % IJ SOLN
INTRAMUSCULAR | Status: AC
  Filled 2013-08-04: qty 10

## 2013-08-04 MED ORDER — PROPOFOL 10 MG/ML IV BOLUS
INTRAVENOUS | Status: DC | PRN
  Administered 2013-08-04: 150 mg via INTRAVENOUS

## 2013-08-04 MED ORDER — DEXAMETHASONE SODIUM PHOSPHATE 10 MG/ML IJ SOLN
INTRAMUSCULAR | Status: AC
  Filled 2013-08-04: qty 1

## 2013-08-04 MED ORDER — PROMETHAZINE HCL 25 MG/ML IJ SOLN: 6.2500 mg | INTRAMUSCULAR | Status: DC | PRN

## 2013-08-04 SURGICAL SUPPLY — 15 items
BAG URO CATCHER STRL LF (DRAPE) ×3 IMPLANT
BASKET ZERO TIP NITINOL 2.4FR (BASKET) ×3 IMPLANT
CATH INTERMIT  6FR 70CM (CATHETERS) ×3 IMPLANT
CLOTH BEACON ORANGE TIMEOUT ST (SAFETY) ×3 IMPLANT
DRAPE CAMERA CLOSED 9X96 (DRAPES) ×3 IMPLANT
FIBER LASER FLEXIVA 365 (UROLOGICAL SUPPLIES) ×3 IMPLANT
GLOVE BIOGEL M STRL SZ7.5 (GLOVE) ×3 IMPLANT
GOWN STRL REUS W/ TWL LRG LVL3 (GOWN DISPOSABLE) ×1 IMPLANT
GOWN STRL REUS W/TWL LRG LVL3 (GOWN DISPOSABLE) ×5 IMPLANT
GUIDEWIRE ANG ZIPWIRE 038X150 (WIRE) IMPLANT
GUIDEWIRE STR DUAL SENSOR (WIRE) ×6 IMPLANT
MANIFOLD NEPTUNE II (INSTRUMENTS) ×3 IMPLANT
PACK CYSTO (CUSTOM PROCEDURE TRAY) ×3 IMPLANT
TUBING CONNECTING 10 (TUBING) ×2 IMPLANT
TUBING CONNECTING 10' (TUBING) ×1

## 2013-08-04 NOTE — Transfer of Care (Signed)
Immediate Anesthesia Transfer of Care Note  Patient: Geoffrey [name]  Procedure(s) Performed: Procedure(s): CYSTOSCOPY, URETEROSCOPY, RETROGRADES (Left) HOLMIUM LASER APPLICATION (Left)  Patient Location: PACU  Anesthesia Type:General  Level of Consciousness: awake, alert  and patient cooperative  Airway & Oxygen Therapy: Patient Spontanous Breathing and Patient connected to face mask oxygen  Post-op Assessment: Report given to PACU RN and Post -op Vital signs reviewed and stable  Post vital signs: Reviewed and stable  Complications: No apparent anesthesia complications

## 2013-08-04 NOTE — Discharge Instructions (Signed)
1. You may see some blood in the urine and may have some burning with urination for 48-72 hours. You also may notice that you have to urinate more frequently or urgently after your procedure which is normal.  °2. You should call should you develop an inability urinate, fever > 101, persistent nausea and vomiting that prevents you from eating or drinking to stay hydrated.  °

## 2013-08-04 NOTE — Interval H&P Note (Signed)
History and Physical Interval Note:  08/04/2013 7:12 AM  Geoffrey [name]  has presented today for surgery, with the diagnosis of LEFT DISTAL URETERAL STONE  The various methods of treatment have been discussed with the patient and family. After consideration of risks, benefits and other options for treatment, the patient has consented to  Procedure(s): CYSTOSCOPY, URETEROSCOPY AND POSSIBLE  STENT PLACEMENT (Left) HOLMIUM LASER APPLICATION (Left) as a surgical intervention .  The patient's history has been reviewed, patient examined, no change in status, stable for surgery.  I have reviewed the patient's chart and labs.  Questions were answered to the patient's satisfaction.     Json Koelzer,LES

## 2013-08-04 NOTE — Anesthesia Postprocedure Evaluation (Signed)
Anesthesia Post Note  Patient: Geoffrey [name]  Procedure(s) Performed: Procedure(s) (LRB): CYSTOSCOPY, URETEROSCOPY, RETROGRADES (Left) HOLMIUM LASER APPLICATION (Left)  Anesthesia type: General  Patient location: PACU  Post pain: Pain level controlled  Post assessment: Post-op Vital signs reviewed  Last Vitals:  Filed Vitals:   08/04/13 1017  BP: 133/70  Pulse: 67  Temp:   Resp: 16    Post vital signs: Reviewed  Level of consciousness: sedated  Complications: No apparent anesthesia complications

## 2013-08-04 NOTE — Op Note (Signed)
Preoperative diagnosis: Left ureteral calculus  Postoperative diagnosis: Left ureteral calculus  Procedure:  1. Cystoscopy 2. Left ureteroscopy and stone removal 3. Ureteroscopic laser lithotripsy 4. Left retrograde pyelography with interpretation  Surgeon: Pryor Curia. M.D.  Anesthesia: General  Complications: None  Intraoperative findings: Left retrograde pyelography demonstrated a filling defect within the distal left ureter consistent with the patient's known calculus without other abnormalities.  EBL: Minimal  Specimens: 1. Left ureteral calculus  Disposition of specimens: [facility] for stone analysis  Indication: Geoffrey [name] is a 63 y.o. year old patient with urolithiasis and a distal left ureteral calculus. After reviewing the management options for treatment, the patient elected to proceed with the above surgical procedure(s). We have discussed the potential benefits and risks of the procedure, side effects of the proposed treatment, the likelihood of the patient achieving the goals of the procedure, and any potential problems that might occur during the procedure or recuperation. Informed consent has been obtained.  Description of procedure:  The patient was taken to the operating room and general anesthesia was induced.  The patient was placed in the dorsal lithotomy position, prepped and draped in the usual sterile fashion, and preoperative antibiotics were administered. A preoperative time-out was performed.   Cystourethroscopy was performed.  The patient's urethra was examined and was normal. The bladder was then systematically examined in its entirety. There was no evidence for any bladder tumors, stones, or other mucosal pathology.    Attention then turned to the left ureteral orifice and a ureteral catheter was used to intubate the ureteral orifice.  Omnipaque contrast was injected through the ureteral catheter and a retrograde pyelogram  was performed with findings as dictated above.  A 0.38 sensor guidewire was then advanced up the left ureter into the renal pelvis under fluoroscopic guidance. The 6 Fr semirigid ureteroscope was then advanced into the ureter next to the guidewire and the calculus was identified.   The stone was then fragmented with the 365 micron holmium laser fiber on a setting of 0.6J and frequency of 6 Hz.   All stones were then removed from the ureter with a zero tip nitinol basket.  Reinspection of the ureter revealed no remaining visible stones or fragments.   The bladder was then emptied and the procedure ended.  The patient appeared to tolerate the procedure well and without complications.  The patient was able to be awakened and transferred to the recovery unit in satisfactory condition.

## 2013-08-05 ENCOUNTER — Encounter (HOSPITAL_COMMUNITY): Payer: Self-pay | Admitting: Emergency Medicine

## 2013-08-05 ENCOUNTER — Emergency Department (HOSPITAL_COMMUNITY)
Admission: EM | Admit: 2013-08-05 | Discharge: 2013-08-05 | Disposition: A | Discharge status: Home / Self Care | Payer: BC Managed Care – PPO | Attending: Emergency Medicine | Admitting: Emergency Medicine

## 2013-08-05 DIAGNOSIS — R011 Cardiac murmur, unspecified: Secondary | ICD-10-CM | POA: Insufficient documentation

## 2013-08-05 DIAGNOSIS — R109 Unspecified abdominal pain: Secondary | ICD-10-CM | POA: Insufficient documentation

## 2013-08-05 DIAGNOSIS — Z85528 Personal history of other malignant neoplasm of kidney: Secondary | ICD-10-CM | POA: Insufficient documentation

## 2013-08-05 DIAGNOSIS — Z87891 Personal history of nicotine dependence: Secondary | ICD-10-CM | POA: Insufficient documentation

## 2013-08-05 DIAGNOSIS — M129 Arthropathy, unspecified: Secondary | ICD-10-CM | POA: Insufficient documentation

## 2013-08-05 DIAGNOSIS — Z87442 Personal history of urinary calculi: Secondary | ICD-10-CM | POA: Insufficient documentation

## 2013-08-05 DIAGNOSIS — Z7982 Long term (current) use of aspirin: Secondary | ICD-10-CM | POA: Insufficient documentation

## 2013-08-05 DIAGNOSIS — I10 Essential (primary) hypertension: Secondary | ICD-10-CM | POA: Insufficient documentation

## 2013-08-05 DIAGNOSIS — E785 Hyperlipidemia, unspecified: Secondary | ICD-10-CM | POA: Insufficient documentation

## 2013-08-05 DIAGNOSIS — N23 Unspecified renal colic: Secondary | ICD-10-CM | POA: Insufficient documentation

## 2013-08-05 DIAGNOSIS — Z79899 Other long term (current) drug therapy: Secondary | ICD-10-CM | POA: Insufficient documentation

## 2013-08-05 DIAGNOSIS — IMO0002 Reserved for concepts with insufficient information to code with codable children: Secondary | ICD-10-CM | POA: Insufficient documentation

## 2013-08-05 DIAGNOSIS — Z8719 Personal history of other diseases of the digestive system: Secondary | ICD-10-CM | POA: Insufficient documentation

## 2013-08-05 LAB — URINALYSIS, ROUTINE W REFLEX MICROSCOPIC
Bilirubin Urine: NEGATIVE
GLUCOSE, UA: NEGATIVE mg/dL
KETONES UR: NEGATIVE mg/dL
Nitrite: NEGATIVE
Protein, ur: 100 mg/dL — AB
Specific Gravity, Urine: 1.03 (ref 1.005–1.030)
UROBILINOGEN UA: 0.2 mg/dL (ref 0.0–1.0)
pH: 5.5 (ref 5.0–8.0)

## 2013-08-05 LAB — URINE MICROSCOPIC-ADD ON

## 2013-08-05 MED ORDER — KETOROLAC TROMETHAMINE 30 MG/ML IJ SOLN
30.0000 mg | Freq: Once | INTRAMUSCULAR | Status: AC
  Administered 2013-08-05: 30 mg via INTRAVENOUS
  Filled 2013-08-05: qty 1

## 2013-08-05 MED ORDER — TAMSULOSIN HCL 0.4 MG PO CAPS
0.4000 mg | ORAL_CAPSULE | Freq: Once | ORAL | Status: AC
  Administered 2013-08-05: 0.4 mg via ORAL
  Filled 2013-08-05: qty 1

## 2013-08-05 MED ORDER — HYDROMORPHONE HCL PF 1 MG/ML IJ SOLN
1.0000 mg | INTRAMUSCULAR | Status: DC | PRN
  Administered 2013-08-05: 1 mg via INTRAVENOUS
  Filled 2013-08-05: qty 1

## 2013-08-05 MED ORDER — ONDANSETRON HCL 4 MG/2ML IJ SOLN
4.0000 mg | Freq: Once | INTRAMUSCULAR | Status: AC
  Administered 2013-08-05: 4 mg via INTRAVENOUS
  Filled 2013-08-05: qty 2

## 2013-08-05 MED ORDER — SODIUM CHLORIDE 0.9 % IV BOLUS (SEPSIS)
500.0000 mL | Freq: Once | INTRAVENOUS | Status: AC
  Administered 2013-08-05: 500 mL via INTRAVENOUS

## 2013-08-05 MED ORDER — SODIUM CHLORIDE 0.9 % IV SOLN
Freq: Once | INTRAVENOUS | Status: AC
  Administered 2013-08-05: 22:00:00 via INTRAVENOUS

## 2013-08-05 NOTE — ED Notes (Signed)
Pt arrived to the Ed with a complaint of right sided flank pain.  Pt had a kidney stone removed yesterday morning.  Pt experienced pain located in the right flank after eating earlier this afternoon

## 2013-08-05 NOTE — ED Provider Notes (Signed)
CSN: [id number]     Arrival date & time 08/05/13  2034 History   First MD Initiated Contact with Patient 08/05/13 2042     Chief Complaint  Patient presents with  . Flank Pain      HPI Patient presents with left flank pain. Chronic left ureteral stone that he is asymptomatic from. However persists for several months and had extracted yesterday. No stent was placed. He was doing well. Sudden onset of left colicky flank pain radiating to his left lower abdomen at home today. Nausea. No vomiting. Good urine output today. No fever.  Past Medical History  Diagnosis Date  . Aortic valve disorders   . Hyperkalemia   . Dyslipidemia   . Gastric ulcer   . Renal cell cancer   . PAC (premature atrial contraction)   . Dilated aortic root   . HTN (hypertension)   . Heart murmur   . Arthritis   . Kidney stone    Past Surgical History  Procedure Laterality Date  . Cataract extraction    . Kidney surgery Left     partial nephrectomy  . Knee surgery    . Tonsillectomy    . Knee arthroscopy with medial menisectomy Left 04/07/2013    Procedure: LEFT KNEE ARTHROSCOPY WITH MEDIAL MENISCECTOMY ABRASION CHONDROPLASTY OF THE PATELLA  AND MEDIAL FEMORAL CONDYLE, MICROFRACTURE TECHNIQUE OF THE MEDIAL FEMORAL CONDYLE ;  Surgeon: Tobi Bastos, MD;  Location: [facility];  Service: Orthopedics;  Laterality: Left;  . Back surgery  2012    lumbar   Family History  Problem Relation Age of Onset  . Hypertension Father   . Dementia Mother   . Hypertension Sister   . Breast cancer Sister    History  Substance Use Topics  . Smoking status: Former Smoker    Quit date: 02/16/1978  . Smokeless tobacco: Never Used  . Alcohol Use: Yes     Comment: 2-3/ week    Review of Systems  Constitutional: Negative for fever, chills, diaphoresis, appetite change and fatigue.  HENT: Negative for mouth sores, sore throat and trouble swallowing.   Eyes: Negative for visual disturbance.  Respiratory: Negative for  cough, chest tightness, shortness of breath and wheezing.   Cardiovascular: Negative for chest pain.  Gastrointestinal: Negative for nausea, vomiting, abdominal pain, diarrhea and abdominal distention.  Endocrine: Negative for polydipsia, polyphagia and polyuria.  Genitourinary: Positive for flank pain. Negative for dysuria, frequency and hematuria.  Musculoskeletal: Negative for gait problem.  Skin: Negative for color change, pallor and rash.  Neurological: Negative for dizziness, syncope, light-headedness and headaches.  Hematological: Does not bruise/bleed easily.  Psychiatric/Behavioral: Negative for behavioral problems and confusion.      Allergies  Zetia and Shrimp  Home Medications   Prior to Admission medications   Medication Sig Start Date End Date Taking? Authorizing Provider  amLODipine-olmesartan (AZOR) 10-40 MG per tablet Take 1 tablet by mouth every evening. 06/24/13  Yes Sueanne Margarita, MD  aspirin EC 81 MG tablet Take 81 mg by mouth daily.   Yes Historical Provider, MD  HYDROcodone-acetaminophen (NORCO/VICODIN) 5-325 MG per tablet Take 1-2 tablets by mouth every 4 (four) hours as needed for moderate pain.   Yes Historical Provider, MD  metoprolol tartrate (LOPRESSOR) 25 MG tablet Take 0.5 tablets (12.5 mg total) by mouth 2 (two) times daily. 07/29/13  Yes Sueanne Margarita, MD  Omega-3 Fatty Acids (FISH OIL PO) Take 1,000 mg by mouth 2 (two) times daily.    Yes  Historical Provider, MD  oxyCODONE-acetaminophen (ROXICET) 5-325 MG per tablet Take 1 tablet by mouth every 4 (four) hours as needed for severe pain. 08/04/13  Yes Raynelle Bring, MD  rosuvastatin (CRESTOR) 40 MG tablet Take 1 tablet (40 mg total) by mouth every evening. 04/27/13  Yes Sueanne Margarita, MD  triamcinolone (NASACORT ALLERGY 24HR) 55 MCG/ACT AERO nasal inhaler Place 1 spray into the nose daily.   Yes Historical Provider, MD   BP 158/65  Pulse 54  Temp(Src) 98.5 F (36.9 C) (Oral)  Resp 22  SpO2  94% Physical Exam  Constitutional: He is oriented to person, place, and time. He appears well-developed and well-nourished. No distress.  HENT:  Head: Normocephalic.  Eyes: Conjunctivae are normal. Pupils are equal, round, and reactive to light. No scleral icterus.  Neck: Normal range of motion. Neck supple. No thyromegaly present.  Cardiovascular: Normal rate and regular rhythm.  Exam reveals no gallop and no friction rub.   No murmur heard. Pulmonary/Chest: Effort normal and breath sounds normal. No respiratory distress. He has no wheezes. He has no rales.  Abdominal: Soft. Bowel sounds are normal. He exhibits no distension. There is no tenderness. There is no rebound.    Musculoskeletal: Normal range of motion.  Neurological: He is alert and oriented to person, place, and time.  Skin: Skin is warm and dry. No rash noted.  Psychiatric: He has a normal mood and affect. His behavior is normal.    ED Course  Procedures (including critical care time) Labs Review Labs Reviewed  URINALYSIS, ROUTINE W REFLEX MICROSCOPIC    Imaging Review No results found.   EKG Interpretation None      MDM   Final diagnoses:  Ureteral colic    Patient's symptoms well controlled after IV Dilaudid. Is good urine output here. Plan to discharge home. Continue hydration at home medications.    Tanna Furry, MD 08/05/13 2241

## 2013-08-05 NOTE — ED Notes (Signed)
Patient reports intermittent left flank pain x1 day, rated 9/10. Denies N/V/D.

## 2013-08-05 NOTE — Discharge Instructions (Signed)
Ureteral Colic Ureteral colic is spasm-like pain from the kidney or the ureter. This is often caused by a kidney stone. The pain is caused by the stone trying to get through the tubes that pass your pee. HOME CARE   Drink enough fluids to keep your pee (urine) clear or pale yellow.  Strain all your pee. A strainer will be provided. Keep anything caught in the strainer and bring it to your doctor. The stone causing the pain may be very small.  Only take medicine as told by your doctor.  Follow up with your doctor as told. GET HELP RIGHT AWAY IF:   Pain is not controlled with medicine.  Pain continues or gets worse.  The pain changes and there is chest or belly (abdominal) pain.  You pass out (faint).  You cannot pee.  You keep throwing up (vomiting).  You have a temperature by mouth above 102 F (38.9 C), not controlled by medicine. MAKE SURE YOU:   Understand these instructions.  Will watch this condition.  Will get help right away if you are not doing well or get worse. Document Released: 06/18/2007 Document Revised: 03/24/2011 Document Reviewed: 06/18/2007 Exit[employer] Patient Information 2015 [employer]. This information is not intended to replace advice given to you by your health care provider. Make sure you discuss any questions you have with your health care provider.  

## 2013-08-07 ENCOUNTER — Emergency Department (HOSPITAL_COMMUNITY): Payer: BC Managed Care – PPO

## 2013-08-07 ENCOUNTER — Emergency Department (HOSPITAL_COMMUNITY)
Admission: EM | Admit: 2013-08-07 | Discharge: 2013-08-07 | Disposition: A | Discharge status: Home / Self Care | Payer: BC Managed Care – PPO | Attending: Emergency Medicine | Admitting: Emergency Medicine

## 2013-08-07 ENCOUNTER — Encounter (HOSPITAL_COMMUNITY): Payer: Self-pay | Admitting: Emergency Medicine

## 2013-08-07 DIAGNOSIS — Z87442 Personal history of urinary calculi: Secondary | ICD-10-CM | POA: Insufficient documentation

## 2013-08-07 DIAGNOSIS — R1032 Left lower quadrant pain: Secondary | ICD-10-CM | POA: Insufficient documentation

## 2013-08-07 DIAGNOSIS — Z87891 Personal history of nicotine dependence: Secondary | ICD-10-CM | POA: Insufficient documentation

## 2013-08-07 DIAGNOSIS — Z85528 Personal history of other malignant neoplasm of kidney: Secondary | ICD-10-CM | POA: Insufficient documentation

## 2013-08-07 DIAGNOSIS — M62838 Other muscle spasm: Secondary | ICD-10-CM | POA: Insufficient documentation

## 2013-08-07 DIAGNOSIS — G8918 Other acute postprocedural pain: Secondary | ICD-10-CM | POA: Insufficient documentation

## 2013-08-07 DIAGNOSIS — Z7982 Long term (current) use of aspirin: Secondary | ICD-10-CM | POA: Insufficient documentation

## 2013-08-07 DIAGNOSIS — Z8719 Personal history of other diseases of the digestive system: Secondary | ICD-10-CM | POA: Insufficient documentation

## 2013-08-07 DIAGNOSIS — Z79899 Other long term (current) drug therapy: Secondary | ICD-10-CM | POA: Insufficient documentation

## 2013-08-07 DIAGNOSIS — I10 Essential (primary) hypertension: Secondary | ICD-10-CM | POA: Insufficient documentation

## 2013-08-07 DIAGNOSIS — R252 Cramp and spasm: Secondary | ICD-10-CM

## 2013-08-07 DIAGNOSIS — E785 Hyperlipidemia, unspecified: Secondary | ICD-10-CM | POA: Insufficient documentation

## 2013-08-07 DIAGNOSIS — M129 Arthropathy, unspecified: Secondary | ICD-10-CM | POA: Insufficient documentation

## 2013-08-07 DIAGNOSIS — R011 Cardiac murmur, unspecified: Secondary | ICD-10-CM | POA: Insufficient documentation

## 2013-08-07 LAB — URINE MICROSCOPIC-ADD ON

## 2013-08-07 LAB — CBC WITH DIFFERENTIAL/PLATELET
Basophils Absolute: 0.1 10*3/uL (ref 0.0–0.1)
Basophils Relative: 1 % (ref 0–1)
Eosinophils Absolute: 0.2 10*3/uL (ref 0.0–0.7)
Eosinophils Relative: 2 % (ref 0–5)
HEMATOCRIT: 40.8 % (ref 39.0–52.0)
Hemoglobin: 12.9 g/dL — ABNORMAL LOW (ref 13.0–17.0)
LYMPHS ABS: 2.3 10*3/uL (ref 0.7–4.0)
LYMPHS PCT: 25 % (ref 12–46)
MCH: 28.6 pg (ref 26.0–34.0)
MCHC: 31.6 g/dL (ref 30.0–36.0)
MCV: 90.5 fL (ref 78.0–100.0)
Monocytes Absolute: 0.7 10*3/uL (ref 0.1–1.0)
Monocytes Relative: 7 % (ref 3–12)
Neutro Abs: 6.1 10*3/uL (ref 1.7–7.7)
Neutrophils Relative %: 65 % (ref 43–77)
Platelets: 208 10*3/uL (ref 150–400)
RBC: 4.51 MIL/uL (ref 4.22–5.81)
RDW: 14.5 % (ref 11.5–15.5)
WBC: 9.5 10*3/uL (ref 4.0–10.5)

## 2013-08-07 LAB — URINALYSIS, ROUTINE W REFLEX MICROSCOPIC
Bilirubin Urine: NEGATIVE
Glucose, UA: NEGATIVE mg/dL
KETONES UR: NEGATIVE mg/dL
Leukocytes, UA: NEGATIVE
Nitrite: NEGATIVE
PH: 5 (ref 5.0–8.0)
Protein, ur: NEGATIVE mg/dL
Specific Gravity, Urine: 1.009 (ref 1.005–1.030)
Urobilinogen, UA: 0.2 mg/dL (ref 0.0–1.0)

## 2013-08-07 LAB — BASIC METABOLIC PANEL
Anion gap: 11 (ref 5–15)
BUN: 14 mg/dL (ref 6–23)
CALCIUM: 8.9 mg/dL (ref 8.4–10.5)
CO2: 24 mEq/L (ref 19–32)
Chloride: 107 mEq/L (ref 96–112)
Creatinine, Ser: 1.1 mg/dL (ref 0.50–1.35)
GFR calc Af Amer: 81 mL/min — ABNORMAL LOW (ref >90)
GFR calc non Af Amer: 70 mL/min — ABNORMAL LOW (ref >90)
GLUCOSE: 95 mg/dL (ref 70–99)
Potassium: 4.6 mEq/L (ref 3.7–5.3)
SODIUM: 142 meq/L (ref 137–147)

## 2013-08-07 MED ORDER — ONDANSETRON HCL 4 MG/2ML IJ SOLN
4.0000 mg | Freq: Once | INTRAMUSCULAR | Status: AC
  Administered 2013-08-07: 4 mg via INTRAVENOUS
  Filled 2013-08-07: qty 2

## 2013-08-07 MED ORDER — HYDROMORPHONE HCL PF 1 MG/ML IJ SOLN
1.0000 mg | Freq: Once | INTRAMUSCULAR | Status: AC
  Administered 2013-08-07: 1 mg via INTRAVENOUS
  Filled 2013-08-07: qty 1

## 2013-08-07 MED ORDER — SODIUM CHLORIDE 0.9 % IV SOLN
INTRAVENOUS | Status: DC
  Administered 2013-08-07: 09:00:00 via INTRAVENOUS

## 2013-08-07 MED ORDER — OXYBUTYNIN CHLORIDE 5 MG PO TABS: 5.0000 mg | ORAL_TABLET | Freq: Three times a day (TID) | ORAL | Status: DC | PRN

## 2013-08-07 MED ORDER — TAMSULOSIN HCL 0.4 MG PO CAPS: 0.4000 mg | ORAL_CAPSULE | Freq: Every day | ORAL | Status: DC | PRN

## 2013-08-07 MED ORDER — KETOROLAC TROMETHAMINE 30 MG/ML IJ SOLN
30.0000 mg | Freq: Once | INTRAMUSCULAR | Status: AC
  Administered 2013-08-07: 30 mg via INTRAVENOUS
  Filled 2013-08-07: qty 1

## 2013-08-07 NOTE — ED Notes (Addendum)
Pt hx of left sided kidney cancer, part of kidney removed. Had kidney stone in left tube of kidney, had cystoscopy on Thursday. Friday night came to ED for left sided flank pain. Now having left sided abdominal pain. Pt was constipated but drank prune juice yesterday and had bowel movement. Pain at present 7/10. Denies n/v/d.  Pt reports cardiac hx.

## 2013-08-07 NOTE — ED Notes (Signed)
Patient residual from bladder scan is 125 notified doctor Tomi Bamberger

## 2013-08-07 NOTE — Discharge Instructions (Signed)
Drink plenty of fluids. Take the oxybutynin 3 times a day for 24 hrs then as needed for spasms, take tamsulosin today then once daily as needed for spasms. Return to the ED if you get a fever, have uncontrolled vomiting or pain. Otherwise follow up with Dr Alinda Money as scheduled.

## 2013-08-07 NOTE — ED Provider Notes (Signed)
CSN: [id number]     Arrival date & time 08/07/13  0706 History   First MD Initiated Contact with Patient 08/07/13 [phone]     Chief Complaint  Patient presents with  . pain post cystoscopy    . Abdominal Pain     (Consider location/radiation/quality/duration/timing/severity/associated sxs/prior Treatment) HPI Patient reports he had an asymptomatic left ureteral stone that was picked up on annual screening that he did for having had renal cell cancer surgery. He reports Dr. Alinda Money did cystoscopy 3 days ago and broke up the stone with lasers. He reports the next night he started having severe left flank pain and had to come to the ED to get pain medication. He reports he did well the following day however last night about 7 PM he started getting pain again. He states he has been taking his oxycodone with temporary or minimal relief of pain. He states his pain is currently severe in the left lower quadrant and sometimes radiates to his left flank. He states he was having bloody urine but that is now resolving. He also reports he had had no BM for 3-4 days and he drink prune juice and that has now resolved. He has not had any fever. He reports his current pain as a 5/10, during the night it was a 7/10.  PCP Dr Leonia Corona U[facility] Dr Alinda Money  Past Medical History  Diagnosis Date  . Aortic valve disorders   . Hyperkalemia   . Dyslipidemia   . Gastric ulcer   . Renal cell cancer   . PAC (premature atrial contraction)   . Dilated aortic root   . HTN (hypertension)   . Heart murmur   . Arthritis   . Kidney stone    Past Surgical History  Procedure Laterality Date  . Cataract extraction    . Kidney surgery Left     partial nephrectomy  . Knee surgery    . Tonsillectomy    . Knee arthroscopy with medial menisectomy Left 04/07/2013    Procedure: LEFT KNEE ARTHROSCOPY WITH MEDIAL MENISCECTOMY ABRASION CHONDROPLASTY OF THE PATELLA  AND MEDIAL FEMORAL CONDYLE, MICROFRACTURE TECHNIQUE OF THE MEDIAL  FEMORAL CONDYLE ;  Surgeon: Tobi Bastos, MD;  Location: [facility];  Service: Orthopedics;  Laterality: Left;  . Back surgery  2012    lumbar   Family History  Problem Relation Age of Onset  . Hypertension Father   . Dementia Mother   . Hypertension Sister   . Breast cancer Sister    History  Substance Use Topics  . Smoking status: Former Smoker    Quit date: 02/16/1978  . Smokeless tobacco: Never Used  . Alcohol Use: Yes     Comment: 2-3/ week  patient is retired  Review of Systems  All other systems reviewed and are negative.     Allergies  Zetia and Shrimp  Home Medications   Prior to Admission medications   Medication Sig Start Date End Date Taking? Authorizing Provider  amLODipine-olmesartan (AZOR) 10-40 MG per tablet Take 1 tablet by mouth every evening. 06/24/13  Yes Sueanne Margarita, MD  metoprolol tartrate (LOPRESSOR) 25 MG tablet Take 0.5 tablets (12.5 mg total) by mouth 2 (two) times daily. 07/29/13  Yes Sueanne Margarita, MD  Omega-3 Fatty Acids (FISH OIL PO) Take 1,000 mg by mouth 2 (two) times daily.    Yes Historical Provider, MD  oxyCODONE-acetaminophen (ROXICET) 5-325 MG per tablet Take 1 tablet by mouth every 4 (four) hours as needed  for severe pain. 08/04/13  Yes Raynelle Bring, MD  rosuvastatin (CRESTOR) 40 MG tablet Take 1 tablet (40 mg total) by mouth every evening. 04/27/13  Yes Sueanne Margarita, MD  triamcinolone (NASACORT ALLERGY 24HR) 55 MCG/ACT AERO nasal inhaler Place 1 spray into the nose daily.   Yes Historical Provider, MD  aspirin EC 81 MG tablet Take 81 mg by mouth daily.    Historical Provider, MD   BP 147/67  Pulse 68  Temp(Src) 98.3 F (36.8 C) (Oral)  Resp 16  SpO2 98%  Vital signs normal    Physical Exam  Nursing note and vitals reviewed. Constitutional: He is oriented to person, place, and time. He appears well-developed and well-nourished.  Non-toxic appearance. He does not appear ill. No distress.  HENT:  Head: Normocephalic and  atraumatic.  Right Ear: External ear normal.  Left Ear: External ear normal.  Nose: Nose normal. No mucosal edema or rhinorrhea.  Mouth/Throat: Oropharynx is clear and moist and mucous membranes are normal. No dental abscesses or uvula swelling.  Eyes: Conjunctivae and EOM are normal. Pupils are equal, round, and reactive to light.  Neck: Normal range of motion and full passive range of motion without pain. Neck supple.  Cardiovascular: Normal rate, regular rhythm and normal heart sounds.  Exam reveals no gallop and no friction rub.   No murmur heard. Pulmonary/Chest: Effort normal and breath sounds normal. No respiratory distress. He has no wheezes. He has no rhonchi. He has no rales. He exhibits no tenderness and no crepitus.  Abdominal: Soft. Normal appearance and bowel sounds are normal. He exhibits no distension. There is no tenderness. There is no rebound and no guarding.    Patient is tender in his left lower quadrant  Musculoskeletal: Normal range of motion. He exhibits no edema and no tenderness.  Moves all extremities well.   Neurological: He is alert and oriented to person, place, and time. He has normal strength. No cranial nerve deficit.  Skin: Skin is warm, dry and intact. No rash noted. No erythema. No pallor.  Psychiatric: He has a normal mood and affect. His speech is normal and behavior is normal. His mood appears not anxious.    ED Course  Procedures (including critical care time)  Medications  0.9 %  sodium chloride infusion ( Intravenous New Bag/Given 08/07/13 0905)  HYDROmorphone (DILAUDID) injection 1 mg (1 mg Intravenous Given 08/07/13 0905)  ondansetron (ZOFRAN) injection 4 mg (4 mg Intravenous Given 08/07/13 0905)  ketorolac (TORADOL) 30 MG/ML injection 30 mg (30 mg Intravenous Given 08/07/13 1150)    Bladder scan showed 125 cc urine  Review of prior studies shows patient had a 4 mm distal left ureteral stone.review of Dr. Lynne Logan operative report states he  removed all obvious stone fragments.  09:39 patient discussed with Dr. Glo Herring, on call for urology. We reviewed his prior CT scans from May 22 and July 14. We discussed his ultrasound and x-rays done today. He is requesting repeat CT scan be done today for further information.  11:00 Dr Glo Herring will look at CT scan and call me back  11:35 Dr Glo Herring has reviewed his CT scan. Suggests starting on oxybutynin 5 mg TID prn, flomax 0.4 mg prn and give IV toradol while in the ED. Pt agreeable to plan.    Labs Review Results for orders placed during the hospital encounter of 37/62/83  BASIC METABOLIC PANEL      Result Value Ref Range   Sodium 142  137 -  147 mEq/L   Potassium 4.6  3.7 - 5.3 mEq/L   Chloride 107  96 - 112 mEq/L   CO2 24  19 - 32 mEq/L   Glucose, Bld 95  70 - 99 mg/dL   BUN 14  6 - 23 mg/dL   Creatinine, Ser 1.10  0.50 - 1.35 mg/dL   Calcium 8.9  8.4 - 10.5 mg/dL   GFR calc non Af Amer 70 (*) >90 mL/min   GFR calc Af Amer 81 (*) >90 mL/min   Anion gap 11  5 - 15  CBC WITH DIFFERENTIAL      Result Value Ref Range   WBC 9.5  4.0 - 10.5 K/uL   RBC 4.51  4.22 - 5.81 MIL/uL   Hemoglobin 12.9 (*) 13.0 - 17.0 g/dL   HCT 40.8  39.0 - 52.0 %   MCV 90.5  78.0 - 100.0 fL   MCH 28.6  26.0 - 34.0 pg   MCHC 31.6  30.0 - 36.0 g/dL   RDW 14.5  11.5 - 15.5 %   Platelets 208  150 - 400 K/uL   Neutrophils Relative % 65  43 - 77 %   Neutro Abs 6.1  1.7 - 7.7 K/uL   Lymphocytes Relative 25  12 - 46 %   Lymphs Abs 2.3  0.7 - 4.0 K/uL   Monocytes Relative 7  3 - 12 %   Monocytes Absolute 0.7  0.1 - 1.0 K/uL   Eosinophils Relative 2  0 - 5 %   Eosinophils Absolute 0.2  0.0 - 0.7 K/uL   Basophils Relative 1  0 - 1 %   Basophils Absolute 0.1  0.0 - 0.1 K/uL  URINALYSIS, ROUTINE W REFLEX MICROSCOPIC      Result Value Ref Range   Color, Urine YELLOW  YELLOW   APPearance CLEAR  CLEAR   Specific Gravity, Urine 1.009  1.005 - 1.030   pH 5.0  5.0 - 8.0   Glucose, UA NEGATIVE  NEGATIVE  mg/dL   Hgb urine dipstick LARGE (*) NEGATIVE   Bilirubin Urine NEGATIVE  NEGATIVE   Ketones, ur NEGATIVE  NEGATIVE mg/dL   Protein, ur NEGATIVE  NEGATIVE mg/dL   Urobilinogen, UA 0.2  0.0 - 1.0 mg/dL   Nitrite NEGATIVE  NEGATIVE   Leukocytes, UA NEGATIVE  NEGATIVE  URINE MICROSCOPIC-ADD ON      Result Value Ref Range   Squamous Epithelial / LPF RARE  RARE   WBC, UA 0-2  <3 WBC/hpf   RBC / HPF TOO NUMEROUS TO COUNT  <3 RBC/hpf   Laboratory interpretation all normal except hematuria     Imaging Review Ct Abdomen Pelvis Wo Contrast  08/07/2013   CLINICAL DATA:  LLQ pain and Lt flank pain s/p cystoscopic laser removal of a left ureteral stone 3 days ago, new LLQ and left flank pain D/W Dr Glo Herring, Urology, requesting CT scan a  EXAM: CT ABDOMEN AND PELVIS WITHOUT CONTRAST  TECHNIQUE: Multidetector CT imaging of the abdomen and pelvis was performed following the standard protocol without IV contrast.  COMPARISON:  07/26/2013 and earlier studies  FINDINGS: Minimal dependent atelectasis at the left lung base. Fatty liver, without focal lesion evident. Unremarkable spleen, adrenal glands, nondilated gallbladder, pancreas, right kidney. Focal parenchymal loss in the lower pole left kidney as before. Previously noted Distal left ureteral calculus is no longer seen. Unenhanced CT was performed per clinician order. Lack of IV contrast limits sensitivity and specificity, especially for evaluation of  abdominal/pelvic solid viscera.  Patchy mild aortoiliac arterial calcifications. Stomach, small bowel, colon are nondilated. Urinary bladder incompletely distended. Bilateral pelvic phleboliths. Mild prostatic enlargement. No ascites. No free air. PLIF L4-5.  IMPRESSION: 1. Interval removal or passage of distal left ureteral calculus, without acute finding.   Electronically Signed   By: Arne Cleveland M.D.   On: 08/07/2013 10:55   US Renal  08/07/2013   CLINICAL DATA:  Left lower quadrant and flank pain,  prior left partial nephrectomy. History of nephrolithiasis.  EXAM: RENAL/URINARY TRACT ULTRASOUND COMPLETE  COMPARISON:  07/26/2013  FINDINGS: Right Kidney:  Length: 13.8 cm. Echogenicity within normal limits. No mass or hydronephrosis visualized.  Left Kidney:  Length: 12.4 cm. Echogenicity within normal limits. No mass or hydronephrosis visualized. Postop changes of the left kidney lower pole better demonstrated by prior CTs.  Bladder:  Appears normal for degree of bladder distention.  IMPRESSION: No acute finding.  Negative for hydronephrosis.   Electronically Signed   By: Daryll Brod M.D.   On: 08/07/2013 09:14   Dg Abd Acute W/chest  08/07/2013   CLINICAL DATA:  ABDOMINAL PAIN  EXAM: ACUTE ABDOMEN SERIES (ABDOMEN 2 VIEW & CHEST 1 VIEW)  COMPARISON:  CT 07/26/2013 and earlier studies  FINDINGS: Tortuous aorta.  Lungs clear.  Heart size normal.  No effusion or pneumothorax.  No free air. Normal bowel gas pattern.  Bilateral pelvic phleboliths. Fixation hardware in the lower lumbar spine.  IMPRESSION: No acute cardiopulmonary disease.  Normal  Bowel gas pattern.   Electronically Signed   By: Arne Cleveland M.D.   On: 08/07/2013 08:31     EKG Interpretation   Date/Time:  Sunday August 07 2013 07:19:45 EDT Ventricular Rate:  74 PR Interval:  184 QRS Duration: 84 QT Interval:  374 QTC Calculation: 415 R Axis:   23 Text Interpretation:  Sinus rhythm atrial bigeminy Low voltage, precordial  leads Anteroseptal infarct, old Borderline repolarization abnormality  Since last tracing rate slower (14 Jul 2010) Confirmed by Atavia Poppe  MD-I, Elroy Schembri  ([id number]) on 08/07/2013 7:38:32 AM      MDM   Patient presents status post cystoscopy with laser removal of a distal left ureteral stone. Patient is most likely having ureteral spasms. Patient is being discharged.   Final diagnoses:  Post-op pain  Spasm    New Prescriptions   OXYBUTYNIN (DITROPAN) 5 MG TABLET    Take 1 tablet (5 mg total) by mouth every 8  (eight) hours as needed (abdominal spasms).   TAMSULOSIN (FLOMAX) 0.4 MG CAPS CAPSULE    Take 1 capsule (0.4 mg total) by mouth daily as needed (abdominal spasms).    Plan discharge   Rolland Porter, MD, Alanson Aly, MD 08/07/13 [phone]

## 2013-08-08 ENCOUNTER — Encounter (HOSPITAL_COMMUNITY): Payer: Self-pay | Admitting: Urology

## 2013-09-02 ENCOUNTER — Other Ambulatory Visit: Payer: Self-pay

## 2013-09-02 MED ORDER — ROSUVASTATIN CALCIUM 40 MG PO TABS: 40.0000 mg | ORAL_TABLET | Freq: Every evening | ORAL | Status: DC

## 2013-12-04 ENCOUNTER — Other Ambulatory Visit: Payer: Self-pay | Admitting: Cardiology

## 2013-12-26 ENCOUNTER — Other Ambulatory Visit: Payer: Self-pay | Admitting: Cardiology

## 2013-12-29 ENCOUNTER — Other Ambulatory Visit: Payer: Self-pay | Admitting: Cardiology

## 2014-02-16 ENCOUNTER — Encounter: Payer: Self-pay | Admitting: Cardiology

## 2014-02-16 ENCOUNTER — Ambulatory Visit (INDEPENDENT_AMBULATORY_CARE_PROVIDER_SITE_OTHER): Payer: BC Managed Care – PPO | Admitting: Cardiology

## 2014-02-16 VITALS — BP 170/70 | HR 65 | Ht 69.0 in | Wt 247.0 lb

## 2014-02-16 DIAGNOSIS — I351 Nonrheumatic aortic (valve) insufficiency: Secondary | ICD-10-CM | POA: Insufficient documentation

## 2014-02-16 DIAGNOSIS — I77810 Thoracic aortic ectasia: Secondary | ICD-10-CM

## 2014-02-16 DIAGNOSIS — I491 Atrial premature depolarization: Secondary | ICD-10-CM

## 2014-02-16 DIAGNOSIS — I10 Essential (primary) hypertension: Secondary | ICD-10-CM

## 2014-02-16 DIAGNOSIS — I359 Nonrheumatic aortic valve disorder, unspecified: Secondary | ICD-10-CM

## 2014-02-16 LAB — BASIC METABOLIC PANEL
BUN: 20 mg/dL (ref 6–23)
CO2: 27 mEq/L (ref 19–32)
CREATININE: 1.13 mg/dL (ref 0.40–1.50)
Calcium: 9.8 mg/dL (ref 8.4–10.5)
Chloride: 108 mEq/L (ref 96–112)
GFR: 69.72 mL/min (ref >60.00)
Glucose, Bld: 113 mg/dL — ABNORMAL HIGH (ref 70–99)
Potassium: 5.2 mEq/L — ABNORMAL HIGH (ref 3.5–5.1)
Sodium: 141 mEq/L (ref 135–145)

## 2014-02-16 NOTE — Progress Notes (Signed)
Cardiology Office Note   Date:  02/16/2014   ID:  Geoffrey [name], DOB 01-04-1951, MRN [id number]  PCP:  [name], MD  Cardiologist:   Sueanne Margarita, MD   No chief complaint on file.     History of Present Illness: Geoffrey [name] is a 64 y.o. male with a history of dilated aortic root, mild to moderate AI and PAC's who presents today for followup. He is doing well. He denies any chest pain, SOB, DOE, dizziness, palpitations or syncope.  He has occasional LE edema at night.      Past Medical History  Diagnosis Date  . Hyperkalemia   . Dyslipidemia   . Gastric ulcer   . Renal cell cancer   . PAC (premature atrial contraction)   . Dilated aortic root   . HTN (hypertension)   . Heart murmur   . Arthritis   . Kidney stone   . Aortic regurgitation     Past Surgical History  Procedure Laterality Date  . Cataract extraction    . Kidney surgery Left     partial nephrectomy  . Knee surgery    . Tonsillectomy    . Knee arthroscopy with medial menisectomy Left 04/07/2013    Procedure: LEFT KNEE ARTHROSCOPY WITH MEDIAL MENISCECTOMY ABRASION CHONDROPLASTY OF THE PATELLA  AND MEDIAL FEMORAL CONDYLE, MICROFRACTURE TECHNIQUE OF THE MEDIAL FEMORAL CONDYLE ;  Surgeon: Tobi Bastos, MD;  Location: [facility];  Service: Orthopedics;  Laterality: Left;  . Back surgery  2012    lumbar  . Cystoscopy with retrograde pyelogram, ureteroscopy and stent placement Left 08/04/2013    Procedure: CYSTOSCOPY, URETEROSCOPY, RETROGRADES;  Surgeon: Raynelle Bring, MD;  Location: [facility];  Service: Urology;  Laterality: Left;  . Holmium laser application Left 0/53/9767    Procedure: HOLMIUM LASER APPLICATION;  Surgeon: Raynelle Bring, MD;  Location: [facility];  Service: Urology;  Laterality: Left;     Current Outpatient Prescriptions  Medication Sig Dispense Refill  . aspirin EC 81 MG tablet Take 81 mg by mouth daily.    . AZOR 10-40 MG per tablet TAKE 1 TABLET BY MOUTH EVERY EVENING. 30 tablet 6  .  CRESTOR 40 MG tablet TAKE 1 TABLET (40 MG TOTAL) BY MOUTH EVERY EVENING. 30 tablet 3  . metoprolol tartrate (LOPRESSOR) 25 MG tablet Take 0.5 tablets (12.5 mg total) by mouth 2 (two) times daily. 30 tablet 6  . Omega-3 Fatty Acids (FISH OIL PO) Take 1,000 mg by mouth 2 (two) times daily.     Marland Kitchen triamcinolone (NASACORT ALLERGY 24HR) 55 MCG/ACT AERO nasal inhaler Place 1 spray into the nose daily.     No current facility-administered medications for this visit.    Allergies:   Zetia and Shrimp    Social History:  The patient  reports that he quit smoking about 36 years ago. He has never used smokeless tobacco. He reports that he drinks alcohol. He reports that he does not use illicit drugs.   Family History:  The patient's family history includes Breast cancer in his sister; Dementia in his mother; Hypertension in his father and sister; Lung cancer in his father.    ROS:  Please see the history of present illness.   Otherwise, review of systems are positive for none.   All other systems are reviewed and negative.    PHYSICAL EXAM: VS:  BP 170/70 mmHg  Pulse 65  Ht 5\' 9"  (1.753 m)  Wt 247 lb (112.038 kg)  BMI 36.46  kg/m2  SpO2 95% , BMI Body mass index is 36.46 kg/(m^2). GEN: Well nourished, well developed, in no acute distress HEENT: normal Neck: no JVD, carotid bruits, or masses Cardiac: RRR; no rubs, or gallops,no edema.  1/6 early diastolic murmur at LUSB Respiratory:  clear to auscultation bilaterally, normal work of breathing GI: soft, nontender, nondistended, + BS MS: no deformity or atrophy Skin: warm and dry, no rash Neuro:  Strength and sensation are intact Psych: euthymic mood, full affect   EKG:  EKG is not ordered today.    Recent Labs: 08/07/2013: BUN 14; Creatinine 1.10; Hemoglobin 12.9*; Platelets 208; Potassium 4.6; Sodium 142    Lipid Panel No results found for: CHOL, TRIG, HDL, CHOLHDL, VLDL, LDLCALC, LDLDIRECT    Wt Readings from Last 3 Encounters:    02/16/14 247 lb (112.038 kg)  08/04/13 243 lb (110.224 kg)  07/29/13 243 lb (110.224 kg)       ASSESSMENT AND PLAN:  1. Mild to Moderate AI - repeat echo pending next month 2. Mild aortic root dilatation - repeat echo pending next month 2. PAC's asymptomatic 3. HTN -elevated today - at home it runs 140/65mmHg.  He had country ham biscuit for dinner last night.  I counseled him on following a low sodium diet.   - continue Azor/Metoprolol - check BMET  Current medicines are reviewed at length with the patient today.  The patient does not have concerns regarding medicines.  The following changes have been made:  no change     Disposition:   FU with me in 1 year   Signed, Sueanne Margarita, MD  02/16/2014 11:04 AM    [facility] [address], [place], [place]  [address] Phone: [phone]; Fax: [phone]

## 2014-02-16 NOTE — Patient Instructions (Addendum)
Your physician has requested that you have an echocardiogram. Echocardiography is a painless test that uses sound waves to create images of your heart. It provides your doctor with information about the size and shape of your heart and how well your heart's chambers and valves are working. This procedure takes approximately one hour. There are no restrictions for this procedure.  Your physician recommends that you have lab work TODAY (BMET).  Your physician wants you to follow-up in: 1 year with Dr. Radford Pax. You will receive a reminder letter in the mail two months in advance. If you don't receive a letter, please call our office to schedule the follow-up appointment.

## 2014-02-20 ENCOUNTER — Telehealth: Payer: Self-pay

## 2014-02-20 ENCOUNTER — Ambulatory Visit (HOSPITAL_COMMUNITY): Payer: BC Managed Care – PPO | Attending: Cardiology | Admitting: Cardiology

## 2014-02-20 DIAGNOSIS — I77810 Thoracic aortic ectasia: Secondary | ICD-10-CM | POA: Diagnosis not present

## 2014-02-20 DIAGNOSIS — I359 Nonrheumatic aortic valve disorder, unspecified: Secondary | ICD-10-CM

## 2014-02-20 DIAGNOSIS — I351 Nonrheumatic aortic (valve) insufficiency: Secondary | ICD-10-CM | POA: Diagnosis not present

## 2014-02-20 NOTE — Telephone Encounter (Addendum)
Patient informed of ECHO results and verbal understanding expressed.  Patient irritated that he did not know of this before. Informed patient that chest CT is simply to better evaluate, and no extreme measures are taken at this time.  Apologized to patient that he wasn't kept informed in the past as much as he would have liked to be. Repeat ECHO ordered to schedule in one year.  Per another result note, instructed patient he will have another lab appointment for repeat BMET the same day as his chest CT.

## 2014-02-20 NOTE — Telephone Encounter (Signed)
-----   Message from Sueanne Margarita, MD sent at 02/20/2014  4:25 PM EST ----- Compared to a year ago the aortic dimensions has increased from 69mm to 19mm.  Please order a Chest CT angio to evaluate dilated ascending aorta

## 2014-02-20 NOTE — Addendum Note (Signed)
Addended by: Harland German A on: 02/20/2014 06:31 PM   Modules accepted: Orders

## 2014-02-20 NOTE — Progress Notes (Signed)
Echo performed. 

## 2014-03-02 ENCOUNTER — Other Ambulatory Visit: Payer: Self-pay | Admitting: Cardiology

## 2014-03-03 ENCOUNTER — Other Ambulatory Visit (INDEPENDENT_AMBULATORY_CARE_PROVIDER_SITE_OTHER): Payer: BC Managed Care – PPO | Admitting: *Deleted

## 2014-03-03 ENCOUNTER — Telehealth: Payer: Self-pay

## 2014-03-03 ENCOUNTER — Ambulatory Visit (INDEPENDENT_AMBULATORY_CARE_PROVIDER_SITE_OTHER)
Admission: RE | Admit: 2014-03-03 | Discharge: 2014-03-03 | Disposition: A | Discharge status: Home / Self Care | Payer: BC Managed Care – PPO | Source: Ambulatory Visit | Attending: Cardiology | Admitting: Cardiology

## 2014-03-03 DIAGNOSIS — I77810 Thoracic aortic ectasia: Secondary | ICD-10-CM

## 2014-03-03 LAB — BASIC METABOLIC PANEL
BUN: 16 mg/dL (ref 6–23)
CO2: 27 meq/L (ref 19–32)
Calcium: 9.5 mg/dL (ref 8.4–10.5)
Chloride: 106 mEq/L (ref 96–112)
Creatinine, Ser: 1.24 mg/dL (ref 0.40–1.50)
GFR: 62.63 mL/min (ref >60.00)
Glucose, Bld: 104 mg/dL — ABNORMAL HIGH (ref 70–99)
Potassium: 4.3 mEq/L (ref 3.5–5.1)
Sodium: 139 mEq/L (ref 135–145)

## 2014-03-03 MED ORDER — IOHEXOL 300 MG/ML  SOLN
100.0000 mL | Freq: Once | INTRAMUSCULAR | Status: AC | PRN
  Administered 2014-03-03: 100 mL via INTRAVENOUS

## 2014-03-03 NOTE — Telephone Encounter (Signed)
Patient informed of results and verbal understanding expressed.  Referral for Dr. Prescott Gum placed. Scan ordered to be repeated in 6 months.  Patient agrees with treatment plan.

## 2014-03-03 NOTE — Telephone Encounter (Signed)
-----   Message from Sueanne Margarita, MD sent at 03/03/2014  1:42 PM EST ----- Please let patient know that chest CT showed mildly dilated thoracic aorta at the sinuses of Valsalva and 4cm in the prox descending aorta.  Please set patient up to see Dr. Prescott Gum with CVTS surgery to follow and repeat scan in 6 months

## 2014-03-15 ENCOUNTER — Institutional Professional Consult (permissible substitution) (INDEPENDENT_AMBULATORY_CARE_PROVIDER_SITE_OTHER): Payer: BC Managed Care – PPO | Admitting: Cardiothoracic Surgery

## 2014-03-15 ENCOUNTER — Encounter: Payer: Self-pay | Admitting: Cardiothoracic Surgery

## 2014-03-15 VITALS — BP 155/80 | HR 68 | Resp 20 | Ht 69.0 in | Wt 235.0 lb

## 2014-03-15 DIAGNOSIS — I7123 Aneurysm of the descending thoracic aorta, without rupture: Secondary | ICD-10-CM

## 2014-03-15 DIAGNOSIS — I712 Thoracic aortic aneurysm, without rupture: Secondary | ICD-10-CM

## 2014-03-15 DIAGNOSIS — Q231 Congenital insufficiency of aortic valve: Secondary | ICD-10-CM

## 2014-03-16 NOTE — Progress Notes (Signed)
PCP is [name], MD Referring Provider is Sueanne Margarita, MD  Chief Complaint  Patient presents with  . 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 A CT scan in 2011 demonstrated the aorta at this location to measure 3.6 cm. The ascending aorta is not enlarged. The aortic arch is of normal size. The patient has a echocardiogram showing mild 2+ AI with a probable bicuspid aortic valve and normal LV systolic function. The patient stopped smoking 30 years ago. The patient has a family history of sudden death from a possible aortic dissection his great- grandfather. No family history of aortic valve replacement. The patient has history of PVCs but no history of atrial fibrillation. He denies angina or symptoms of heart failure. He infrequentlyhe has his blood pressure measured.  The patient states he's had a heart murmur for a few years. He denies history of rheumatic fever as a child. He states he did have febrile seizures as an infant associated with a viral illness.  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 He has had multiple surgical procedures without cardiac complication including bilateral knee surgery, left partial nephrectomy for renal cell cancer, lumbar laminectomy, and renal stone extraction and cataract surgery.  The patient is chest or back pain symptoms of CHF but he does have some ankle swelling probably associated with his Norvasc.  The patient has some active dental disease and is scheduled to undergo dental extractions tomorrow. We discussed antibiotic prophylaxis for a bicuspid valve and he was  given a prescription for amoxicillin with instructions to be use appropriately.  Past Medical History  Diagnosis Date  . Hyperkalemia   . Dyslipidemia   . Gastric ulcer   . Renal cell cancer   . PAC (premature atrial contraction)   . Dilated aortic root   . HTN (hypertension)   . Heart murmur   . Arthritis   . Kidney stone   . Aortic regurgitation     Past Surgical History  Procedure Laterality Date  . Cataract extraction    . Kidney surgery Left     partial nephrectomy  . Knee surgery    . Tonsillectomy    . Knee arthroscopy with medial menisectomy Left 04/07/2013    Procedure: LEFT KNEE ARTHROSCOPY WITH MEDIAL MENISCECTOMY ABRASION CHONDROPLASTY OF THE PATELLA  AND MEDIAL FEMORAL CONDYLE, MICROFRACTURE TECHNIQUE OF THE MEDIAL FEMORAL CONDYLE ;  Surgeon: Tobi Bastos, MD;  Location: [facility];  Service: Orthopedics;  Laterality: Left;  . Back surgery  2012    lumbar  . Cystoscopy with retrograde pyelogram, ureteroscopy and stent placement Left 08/04/2013    Procedure: CYSTOSCOPY, URETEROSCOPY, RETROGRADES;  Surgeon: Raynelle Bring, MD;  Location: [facility];  Service: Urology;  Laterality: Left;  . Holmium laser application Left 04/21/8117    Procedure: HOLMIUM LASER APPLICATION;  Surgeon: Raynelle Bring, MD;  Location: [facility];  Service: Urology;  Laterality: Left;    Family History  Problem Relation Age of Onset  . Hypertension Father   . Lung cancer Father   . Dementia Mother   . Hypertension Sister   . Breast cancer Sister     Social History History  Substance Use Topics  . Smoking status: Former Smoker    Quit date: 02/16/1978  . Smokeless tobacco: Never Used  . Alcohol Use: Yes     Comment: 2-3/ week    Current Outpatient Prescriptions  Medication Sig Dispense Refill  . aspirin EC 81 MG tablet Take 81 mg by mouth daily.    . AZOR 10-40 MG per tablet TAKE 1 TABLET BY MOUTH EVERY EVENING. 30 tablet 6  . CRESTOR 40 MG tablet TAKE 1 TABLET (40 MG TOTAL) BY MOUTH  EVERY EVENING. 30 tablet 3  . metoprolol tartrate (LOPRESSOR) 25 MG tablet TAKE 0.5 TABLETS (12.5 MG TOTAL) BY MOUTH 2 (TWO) TIMES DAILY. 30 tablet 10  . Omega-3 Fatty Acids (FISH OIL PO) Take 1,000 mg by mouth 2 (two) times daily.     Marland Kitchen triamcinolone (NASACORT ALLERGY 24HR) 55 MCG/ACT AERO nasal inhaler Place 1 spray into the nose daily.     No current facility-administered medications for this visit.    Allergies  Allergen Reactions  . Zetia [Ezetimibe]     rash  . Shrimp [Shellfish Allergy] Rash    Review of Systems    General:   Normal appetite, no weight loss, no fever or night sweats Cardiac:      - Chest pain with exertion,  - chest pain at rest,  - SOB with exertion,                    -PND, -  orthopnea,  - palpitations or arrhythmias,   -  history atrial fibrillation                  -dizzy spells-presyncope,  - - Syncope,+  LE edema Respiratory:  No Shortness of breath,  no home oxygen, no productive cough, no                  sleep apnea, no CPAP at night, no hemoptysis, no COPD GI:           No difficulty swallowing, no reflux, no hiatal hernia-heartburn, no chronic                          abdominal pain, no hematochezia, no hematemesis, no melena GU:        No dysuria, no frequency, no UTI recently, no hematuria, no kidney stones,               No BPH Vascular: No pain suggestive of claudication, no varicose veins, no DVT, no nonhealing                Foot ulcer, no rest pain suggestive of ischemia Neuro:   No stroke, no TIAs, no seizures, no neuropathy, no gait instability, no                             memory/cognitive dysfunction                Musculoskeletal:  No arthritis, no joint swelling, no difficulty walking, no decreased                        Mobility Skin:     No rash, no ulcerations or pressure sores Psych:    No anxiety, no depression, Eyes:    No change in vision, no amaurosis, no eye surgery ENT:    No hearing loss, no loose  or painful teeth, no  dentures, + recent dental                          procedure Hematologic:  No easy bruising, no bleeding disorder, no frequent epistaxes Endocrine:  No diabetes,   checks CBG at home    BP 155/80 mmHg  Pulse 68  Resp 20  Ht 5\' 9"  (1.753 m)  Wt 235 lb (106.595 kg)  BMI 34.69 kg/m2  SpO2 96% Physical Exam  General: overweight middle-aged Caucasian male in no acute distress accompanied by wife HEENT: Normocephalic pupils equal , dentition adequate Neck: Supple without JVD, adenopathy, or bruit Chest: Clear to auscultation, symmetrical breath sounds, no rhonchi, no tenderness             or deformity Cardiovascular: Regular rate and rhythm, 1/6 diastolic murmur at left lower sternal border, no gallop, peripheral pulses             palpable in all extremities Abdomen:  Soft, nontender, no palpable mass or organomegaly Extremities: Warm, well-perfused, no clubbing cyanosis edema or tenderness,              no venous stasis changes of the legs Rectal/GU: Deferred Neuro: Grossly non--focal and symmetrical throughout Skin: Clean and dry without rash or ulceration   Diagnostic Tests: Echocardiogram shows probable bicuspid valve without significant aortic stenosis and mild aortic insufficiency. Good LV function. Mild LVH.  CTA thoracic aorta shows the ascending aorta to be approximately 3.5 cm with increase in size of the proximal descending thoracic aorta 4.0 cm. The aortic wall is smooth walled ulceration with minimal calcification. There has been an increase in the size of the proximal descending thoracic aorta since last CT scan 2011.  Impression: Moderate thoracic aortic disease of the proximal descending thoracic aorta. With blood pressure control and lipid control and tobacco avoidance the patient's long-term outlook is good. However he'll need serial scans to follow the mild dilatation of the proximal descending thoracic aorta.4 cm--especially since it is shown increase in size over  the past 5 years.  The patient's blood pressure does not appear adequately controlled. He states he cannot take an increased dose of beta blocker because of bradycardia. I've asked him to start losartan 50 mg daily instead of the combination amlodipine-ARB medication which does not appear to be working. He will get his blood pressure check in 2 weeks after starting losartan, which has shown to be more effective in preventing thoracic aortic dilatation. Plan: The patient return in 6 months with a followup CT scan of the thoracic aorta because of the documented increase in size on last CT scan. If there is no significant change then we'll reduce the frequency of surveillance scans. Blood pressure control is of paramount importance and I have recommended he keep a log of his blood pressures and get them checked weekly while he is making in the medication change. He does not wish to return to this office because of the high co-pay so he will try to get his blood pressure checked respiratory care office which I have recommended.

## 2014-04-20 ENCOUNTER — Observation Stay (HOSPITAL_COMMUNITY)
Admission: EM | Admit: 2014-04-20 | Discharge: 2014-04-22 | Disposition: A | Discharge status: Other Acute Inpatient Hospital | Payer: BC Managed Care – PPO | Attending: Internal Medicine | Admitting: Internal Medicine

## 2014-04-20 ENCOUNTER — Encounter (HOSPITAL_COMMUNITY): Payer: Self-pay | Admitting: Emergency Medicine

## 2014-04-20 DIAGNOSIS — Z7982 Long term (current) use of aspirin: Secondary | ICD-10-CM | POA: Insufficient documentation

## 2014-04-20 DIAGNOSIS — M25512 Pain in left shoulder: Secondary | ICD-10-CM | POA: Diagnosis not present

## 2014-04-20 DIAGNOSIS — Z85528 Personal history of other malignant neoplasm of kidney: Secondary | ICD-10-CM | POA: Insufficient documentation

## 2014-04-20 DIAGNOSIS — R509 Fever, unspecified: Secondary | ICD-10-CM | POA: Diagnosis not present

## 2014-04-20 DIAGNOSIS — I77810 Thoracic aortic ectasia: Secondary | ICD-10-CM | POA: Diagnosis present

## 2014-04-20 DIAGNOSIS — I252 Old myocardial infarction: Secondary | ICD-10-CM | POA: Diagnosis not present

## 2014-04-20 DIAGNOSIS — M1990 Unspecified osteoarthritis, unspecified site: Secondary | ICD-10-CM | POA: Diagnosis not present

## 2014-04-20 DIAGNOSIS — R7989 Other specified abnormal findings of blood chemistry: Secondary | ICD-10-CM | POA: Diagnosis not present

## 2014-04-20 DIAGNOSIS — Q231 Congenital insufficiency of aortic valve: Secondary | ICD-10-CM | POA: Insufficient documentation

## 2014-04-20 DIAGNOSIS — I714 Abdominal aortic aneurysm, without rupture, unspecified: Secondary | ICD-10-CM | POA: Diagnosis present

## 2014-04-20 DIAGNOSIS — I160 Hypertensive urgency: Secondary | ICD-10-CM | POA: Diagnosis present

## 2014-04-20 DIAGNOSIS — I359 Nonrheumatic aortic valve disorder, unspecified: Secondary | ICD-10-CM | POA: Diagnosis present

## 2014-04-20 DIAGNOSIS — Z87891 Personal history of nicotine dependence: Secondary | ICD-10-CM | POA: Diagnosis not present

## 2014-04-20 DIAGNOSIS — E785 Hyperlipidemia, unspecified: Secondary | ICD-10-CM | POA: Diagnosis not present

## 2014-04-20 DIAGNOSIS — K259 Gastric ulcer, unspecified as acute or chronic, without hemorrhage or perforation: Secondary | ICD-10-CM | POA: Insufficient documentation

## 2014-04-20 DIAGNOSIS — I10 Essential (primary) hypertension: Secondary | ICD-10-CM | POA: Diagnosis not present

## 2014-04-20 DIAGNOSIS — R079 Chest pain, unspecified: Secondary | ICD-10-CM | POA: Diagnosis present

## 2014-04-20 DIAGNOSIS — R0789 Other chest pain: Principal | ICD-10-CM | POA: Insufficient documentation

## 2014-04-20 HISTORY — DX: Aortic aneurysm of unspecified site, without rupture: I71.9

## 2014-04-20 LAB — CBC
HEMATOCRIT: 46 % (ref 39.0–52.0)
Hemoglobin: 14.7 g/dL (ref 13.0–17.0)
MCH: 28.6 pg (ref 26.0–34.0)
MCHC: 32 g/dL (ref 30.0–36.0)
MCV: 89.5 fL (ref 78.0–100.0)
Platelets: 210 10*3/uL (ref 150–400)
RBC: 5.14 MIL/uL (ref 4.22–5.81)
RDW: 14 % (ref 11.5–15.5)
WBC: 7.8 10*3/uL (ref 4.0–10.5)

## 2014-04-20 LAB — I-STAT TROPONIN, ED: Troponin i, poc: 0.02 ng/mL (ref 0.00–0.08)

## 2014-04-20 NOTE — ED Notes (Signed)
Pt states he was home and took his blood pressure and it was elevated so he went to the fire station and had it checked and it was 220/110  They sent him here for evaluation   Pt states while in the lobby he started having chest pain in the left chest into the left shoulder  Pt states he has shortness of breath on exertion  Pt states he also had five teeth pulled yesterday  Pt states he last took pain med at 0700 this morning

## 2014-04-21 ENCOUNTER — Emergency Department (HOSPITAL_COMMUNITY): Payer: BC Managed Care – PPO

## 2014-04-21 ENCOUNTER — Encounter (HOSPITAL_COMMUNITY): Payer: Self-pay

## 2014-04-21 ENCOUNTER — Encounter (HOSPITAL_COMMUNITY)
Admission: EM | Disposition: A | Discharge status: Other Acute Inpatient Hospital | Payer: Self-pay | Source: Home / Self Care | Attending: Internal Medicine

## 2014-04-21 DIAGNOSIS — R509 Fever, unspecified: Secondary | ICD-10-CM | POA: Diagnosis present

## 2014-04-21 DIAGNOSIS — I160 Hypertensive urgency: Secondary | ICD-10-CM | POA: Diagnosis present

## 2014-04-21 DIAGNOSIS — I359 Nonrheumatic aortic valve disorder, unspecified: Secondary | ICD-10-CM

## 2014-04-21 DIAGNOSIS — I209 Angina pectoris, unspecified: Secondary | ICD-10-CM | POA: Diagnosis not present

## 2014-04-21 DIAGNOSIS — I77810 Thoracic aortic ectasia: Secondary | ICD-10-CM

## 2014-04-21 DIAGNOSIS — R079 Chest pain, unspecified: Secondary | ICD-10-CM | POA: Diagnosis present

## 2014-04-21 DIAGNOSIS — I714 Abdominal aortic aneurysm, without rupture, unspecified: Secondary | ICD-10-CM | POA: Diagnosis present

## 2014-04-21 DIAGNOSIS — I10 Essential (primary) hypertension: Secondary | ICD-10-CM

## 2014-04-21 HISTORY — PX: LEFT HEART CATHETERIZATION WITH CORONARY ANGIOGRAM: SHX5451

## 2014-04-21 LAB — BASIC METABOLIC PANEL
ANION GAP: 8 (ref 5–15)
BUN: 15 mg/dL (ref 6–23)
CO2: 24 mmol/L (ref 19–32)
CREATININE: 1.25 mg/dL (ref 0.50–1.35)
Calcium: 9.1 mg/dL (ref 8.4–10.5)
Chloride: 105 mmol/L (ref 96–112)
GFR calc Af Amer: 70 mL/min — ABNORMAL LOW (ref >90)
GFR calc non Af Amer: 60 mL/min — ABNORMAL LOW (ref >90)
GLUCOSE: 149 mg/dL — AB (ref 70–99)
Potassium: 4.5 mmol/L (ref 3.5–5.1)
Sodium: 137 mmol/L (ref 135–145)

## 2014-04-21 LAB — BASIC METABOLIC PANEL WITH GFR
Anion gap: 12 (ref 5–15)
BUN: 14 mg/dL (ref 6–23)
CO2: 24 mmol/L (ref 19–32)
Calcium: 9.8 mg/dL (ref 8.4–10.5)
Chloride: 106 mmol/L (ref 96–112)
Creatinine, Ser: 1.02 mg/dL (ref 0.50–1.35)
GFR calc Af Amer: 89 mL/min — ABNORMAL LOW
GFR calc non Af Amer: 77 mL/min — ABNORMAL LOW
Glucose, Bld: 95 mg/dL (ref 70–99)
Potassium: 4.9 mmol/L (ref 3.5–5.1)
Sodium: 142 mmol/L (ref 135–145)

## 2014-04-21 LAB — HEPATIC FUNCTION PANEL
ALBUMIN: 4.2 g/dL (ref 3.5–5.2)
ALT: 36 U/L (ref 0–53)
AST: 35 U/L (ref 0–37)
Alkaline Phosphatase: 80 U/L (ref 39–117)
BILIRUBIN TOTAL: 0.6 mg/dL (ref 0.3–1.2)
Bilirubin, Direct: <0.1 mg/dL (ref 0.0–0.5)
Total Protein: 7.2 g/dL (ref 6.0–8.3)

## 2014-04-21 LAB — BRAIN NATRIURETIC PEPTIDE: B Natriuretic Peptide: 90.1 pg/mL (ref 0.0–100.0)

## 2014-04-21 LAB — TROPONIN I
TROPONIN I: 0.04 ng/mL — AB (ref <0.031)
TROPONIN I: 0.03 ng/mL (ref <0.031)

## 2014-04-21 SURGERY — LEFT HEART CATHETERIZATION WITH CORONARY ANGIOGRAM
Anesthesia: LOCAL

## 2014-04-21 MED ORDER — LOSARTAN POTASSIUM 50 MG PO TABS
50.0000 mg | ORAL_TABLET | Freq: Every day | ORAL | Status: DC
  Administered 2014-04-21: 50 mg via ORAL
  Filled 2014-04-21: qty 1

## 2014-04-21 MED ORDER — OXYCODONE-ACETAMINOPHEN 10-325 MG PO TABS: 1.0000 | ORAL_TABLET | ORAL | Status: DC | PRN

## 2014-04-21 MED ORDER — ROSUVASTATIN CALCIUM 10 MG PO TABS
40.0000 mg | ORAL_TABLET | Freq: Every day | ORAL | Status: DC
  Administered 2014-04-21: 40 mg via ORAL
  Filled 2014-04-21: qty 4
  Filled 2014-04-21: qty 1

## 2014-04-21 MED ORDER — ONDANSETRON HCL 4 MG/2ML IJ SOLN: 4.0000 mg | Freq: Four times a day (QID) | INTRAMUSCULAR | Status: DC | PRN

## 2014-04-21 MED ORDER — FLUTICASONE PROPIONATE 50 MCG/ACT NA SUSP
2.0000 | Freq: Every day | NASAL | Status: DC
  Administered 2014-04-21: 2 via NASAL
  Filled 2014-04-21: qty 16

## 2014-04-21 MED ORDER — MIDAZOLAM HCL 2 MG/2ML IJ SOLN
INTRAMUSCULAR | Status: AC
  Filled 2014-04-21: qty 2

## 2014-04-21 MED ORDER — KETOROLAC TROMETHAMINE 30 MG/ML IJ SOLN
30.0000 mg | Freq: Once | INTRAMUSCULAR | Status: AC
  Administered 2014-04-21: 30 mg via INTRAVENOUS
  Filled 2014-04-21: qty 1

## 2014-04-21 MED ORDER — CHLORHEXIDINE GLUCONATE 0.12 % MT SOLN
15.0000 mL | Freq: Two times a day (BID) | OROMUCOSAL | Status: DC
  Administered 2014-04-21 (×2): 15 mL via OROMUCOSAL
  Filled 2014-04-21 (×2): qty 15

## 2014-04-21 MED ORDER — TRIAMCINOLONE ACETONIDE 55 MCG/ACT NA AERO: 1.0000 | INHALATION_SPRAY | Freq: Every day | NASAL | Status: DC

## 2014-04-21 MED ORDER — METOCLOPRAMIDE HCL 5 MG/ML IJ SOLN
10.0000 mg | Freq: Once | INTRAMUSCULAR | Status: AC
  Administered 2014-04-21: 10 mg via INTRAVENOUS
  Filled 2014-04-21: qty 2

## 2014-04-21 MED ORDER — OXYCODONE-ACETAMINOPHEN 5-325 MG PO TABS: 1.0000 | ORAL_TABLET | ORAL | Status: DC | PRN

## 2014-04-21 MED ORDER — DIPHENHYDRAMINE HCL 50 MG/ML IJ SOLN
12.5000 mg | Freq: Once | INTRAMUSCULAR | Status: AC
  Administered 2014-04-21: 12.5 mg via INTRAVENOUS
  Filled 2014-04-21: qty 1

## 2014-04-21 MED ORDER — GI COCKTAIL ~~LOC~~
30.0000 mL | Freq: Once | ORAL | Status: AC
  Administered 2014-04-21: 30 mL via ORAL
  Filled 2014-04-21: qty 30

## 2014-04-21 MED ORDER — VERAPAMIL HCL 2.5 MG/ML IV SOLN
INTRAVENOUS | Status: AC
  Filled 2014-04-21: qty 2

## 2014-04-21 MED ORDER — METOPROLOL SUCCINATE ER 25 MG PO TB24
12.5000 mg | ORAL_TABLET | Freq: Two times a day (BID) | ORAL | Status: DC
  Administered 2014-04-21 (×2): 12.5 mg via ORAL
  Filled 2014-04-21 (×3): qty 1

## 2014-04-21 MED ORDER — HEPARIN SODIUM (PORCINE) 1000 UNIT/ML IJ SOLN
INTRAMUSCULAR | Status: AC
  Filled 2014-04-21: qty 1

## 2014-04-21 MED ORDER — FENTANYL CITRATE 0.05 MG/ML IJ SOLN
INTRAMUSCULAR | Status: AC
  Filled 2014-04-21: qty 2

## 2014-04-21 MED ORDER — SODIUM CHLORIDE 0.9 % IV SOLN: 250.0000 mL | INTRAVENOUS | Status: DC | PRN

## 2014-04-21 MED ORDER — ACETAMINOPHEN 325 MG PO TABS
650.0000 mg | ORAL_TABLET | ORAL | Status: DC | PRN
  Administered 2014-04-22: 650 mg via ORAL
  Filled 2014-04-21: qty 2

## 2014-04-21 MED ORDER — ASPIRIN EC 81 MG PO TBEC
81.0000 mg | DELAYED_RELEASE_TABLET | Freq: Every day | ORAL | Status: DC
  Administered 2014-04-21: 81 mg via ORAL
  Filled 2014-04-21: qty 1

## 2014-04-21 MED ORDER — SODIUM CHLORIDE 0.9 % IJ SOLN: 3.0000 mL | INTRAMUSCULAR | Status: DC | PRN

## 2014-04-21 MED ORDER — HYDRALAZINE HCL 20 MG/ML IJ SOLN
10.0000 mg | INTRAMUSCULAR | Status: DC | PRN
  Administered 2014-04-21: 10 mg via INTRAVENOUS
  Filled 2014-04-21: qty 1

## 2014-04-21 MED ORDER — IOHEXOL 350 MG/ML SOLN
100.0000 mL | Freq: Once | INTRAVENOUS | Status: AC | PRN
  Administered 2014-04-21: 100 mL via INTRAVENOUS

## 2014-04-21 MED ORDER — OXYCODONE HCL 5 MG PO TABS: 5.0000 mg | ORAL_TABLET | ORAL | Status: DC | PRN

## 2014-04-21 MED ORDER — LIDOCAINE HCL (PF) 1 % IJ SOLN
INTRAMUSCULAR | Status: AC
  Filled 2014-04-21: qty 30

## 2014-04-21 MED ORDER — DEXTROSE 5 % IV SOLN
1.0000 g | Freq: Once | INTRAVENOUS | Status: AC
  Administered 2014-04-21: 1 g via INTRAVENOUS

## 2014-04-21 MED ORDER — AMOXICILLIN 500 MG PO CAPS
500.0000 mg | ORAL_CAPSULE | Freq: Three times a day (TID) | ORAL | Status: DC
  Administered 2014-04-21 (×2): 500 mg via ORAL
  Filled 2014-04-21 (×6): qty 1
  Filled 2014-04-21: qty 2
  Filled 2014-04-21 (×2): qty 1

## 2014-04-21 MED ORDER — SODIUM CHLORIDE 0.9 % IJ SOLN
3.0000 mL | Freq: Two times a day (BID) | INTRAMUSCULAR | Status: DC
  Administered 2014-04-21: 3 mL via INTRAVENOUS

## 2014-04-21 MED ORDER — SODIUM CHLORIDE 0.9 % IV SOLN
INTRAVENOUS | Status: DC
  Administered 2014-04-21: 14:00:00 via INTRAVENOUS

## 2014-04-21 MED ORDER — NITROGLYCERIN 1 MG/10 ML FOR IR/CATH LAB
INTRA_ARTERIAL | Status: AC
  Filled 2014-04-21: qty 10

## 2014-04-21 MED ORDER — HEPARIN SODIUM (PORCINE) 5000 UNIT/ML IJ SOLN
5000.0000 [IU] | Freq: Three times a day (TID) | INTRAMUSCULAR | Status: DC
  Administered 2014-04-21: 5000 [IU] via SUBCUTANEOUS
  Filled 2014-04-21: qty 1

## 2014-04-21 MED ORDER — SODIUM CHLORIDE 0.9 % IV SOLN: INTRAVENOUS | Status: AC

## 2014-04-21 MED ORDER — HEPARIN (PORCINE) IN NACL 2-0.9 UNIT/ML-% IJ SOLN
INTRAMUSCULAR | Status: AC
  Filled 2014-04-21: qty 1500

## 2014-04-21 NOTE — Consult Note (Signed)
Name: Geoffrey [name] is a 64 y.o. male Admit date: 04/20/2014 Referring Physician:  Alcario Drought, M.D. Primary Physician:  [name], M.D. Primary Cardiologist:  Fransico Him, M.D.  Reason for Consultation:  Prolonged chest and left shoulder discomfort with minimally elevated troponin  ASSESSMENT:  1. Chest pain with minimal increase in troponin 2. Coronary atherosclerosis based on CT findings with three-vessel calcification  3. Descending aortic dilatation, and abdominal aortic aneurysm  4.  Bicuspid Aortic Valve with regurgitation, moderate 5. Hypertension  PLAN:  1. Under the circumstances, I have recommended that the patient undergo coronary angiography to exclude the possibility of left anterior descending disease (EKG as long shown evidence of an anterior infarction. Perhaps it is totally occluded and collateralized). The procedure and risk of coronary angiography were discussed in detail with the patient, including the possibility of stroke, death, myocardial infarction, kidney injury, bleeding, allergy, and surgery. Patient understands and accepts these risks. 2. Control blood pressure 3. Risk factor modification 4. Continued observation of descending and abdominal aortic enlargement 6. Renal cell cancer status post partial nephrectomy  HPI: 64 year old gentleman with a history of aortic valve disease, hypertension, abdominal aortic aneurysm, and dilated ascending aorta. He was admitted with the sudden onset of left precordial discomfort that radiated into the left shoulder. The troponin is minimally elevated. He has never before had such chest discomfort. He did have a normal myocardial perfusion study in 2012 but it is unclear to me if the EKG evidence of anterior infarction was present at that time.  PMH:   Past Medical History  Diagnosis Date  . Hyperkalemia   . Dyslipidemia   . Gastric ulcer   . Renal cell cancer   . PAC (premature atrial contraction)   . Dilated aortic  root   . HTN (hypertension)   . Heart murmur   . Arthritis   . Kidney stone   . Aortic regurgitation   . Aortic aneurysm     PSH:   Past Surgical History  Procedure Laterality Date  . Cataract extraction    . Kidney surgery Left     partial nephrectomy  . Knee surgery    . Tonsillectomy    . Knee arthroscopy with medial menisectomy Left 04/07/2013    Procedure: LEFT KNEE ARTHROSCOPY WITH MEDIAL MENISCECTOMY ABRASION CHONDROPLASTY OF THE PATELLA  AND MEDIAL FEMORAL CONDYLE, MICROFRACTURE TECHNIQUE OF THE MEDIAL FEMORAL CONDYLE ;  Surgeon: Tobi Bastos, MD;  Location: [facility];  Service: Orthopedics;  Laterality: Left;  . Back surgery  2012    lumbar  . Cystoscopy with retrograde pyelogram, ureteroscopy and stent placement Left 08/04/2013    Procedure: CYSTOSCOPY, URETEROSCOPY, RETROGRADES;  Surgeon: Raynelle Bring, MD;  Location: [facility];  Service: Urology;  Laterality: Left;  . Holmium laser application Left 7/84/6962    Procedure: HOLMIUM LASER APPLICATION;  Surgeon: Raynelle Bring, MD;  Location: [facility];  Service: Urology;  Laterality: Left;   Allergies:  Zetia and Shrimp Prior to Admit Meds:   Prescriptions prior to admission  Medication Sig Dispense Refill Last Dose  . amoxicillin (AMOXIL) 500 MG tablet Take 1 tablet by mouth 3 (three) times daily. For 7 days  0 04/20/2014 at Unknown time  . aspirin EC 81 MG tablet Take 81 mg by mouth daily.   04/20/2014 at Unknown time  . chlorhexidine (PERIDEX) 0.12 % solution 15 mLs. Swish for 30 sec then spit twice daily  0 04/20/2014 at Unknown time  . losartan (COZAAR) 50 MG tablet  Take 50 mg by mouth daily.   04/20/2014 at Unknown time  . metoprolol succinate (TOPROL-XL) 25 MG 24 hr tablet Take 12.5 mg by mouth 2 (two) times daily.   04/20/2014 at 2000  . Omega-3 Fatty Acids (FISH OIL PO) Take 1,000 mg by mouth 2 (two) times daily.    04/20/2014 at Unknown time  . oxyCODONE-acetaminophen (PERCOCET) 10-325 MG per tablet Take 1 tablet by mouth every 4  (four) hours as needed. for pain  0 04/20/2014 at 0700  . rosuvastatin (CRESTOR) 40 MG tablet Take 40 mg by mouth daily.   04/20/2014 at Unknown time  . triamcinolone (NASACORT ALLERGY 24HR) 55 MCG/ACT AERO nasal inhaler Place 1 spray into the nose daily.   04/20/2014 at Unknown time   Fam HX:    Family History  Problem Relation Age of Onset  . Hypertension Father   . Lung cancer Father   . Dementia Mother   . Hypertension Sister   . Breast cancer Sister    Social HX:    History   Social History  . Marital Status: Married    Spouse Name: N/A  . Number of Children: N/A  . Years of Education: N/A   Occupational History  . Not on file.   Social History Main Topics  . Smoking status: Former Smoker    Quit date: 02/16/1978  . Smokeless tobacco: Never Used  . Alcohol Use: Yes     Comment: 2-3/ week  . Drug Use: No  . Sexual Activity: Not on file   Other Topics Concern  . Not on file   Social History Narrative     Review of Systems: Denies claudication. No history of CVA.  Physical Exam: Blood pressure 146/53, pulse 72, temperature 98.2 F (36.8 C), temperature source Oral, resp. rate 18, height 5\' 9"  (1.753 m), weight 235 lb 10.8 oz (106.9 kg), SpO2 97 %. Weight change:    Lying flat in bed Chest is clear No carotid bruits Extremities reveal 2+ and symmetric pulses radials, femorals, and posterior tibials bilaterally. Cardiac exam reveals a grade 2 to 3/6 decrescendo low pain aortic regurgitation murmur Abdomen is soft Neurological exam is normal   Labs: Lab Results  Component Value Date   WBC 7.8 04/20/2014   HGB 14.7 04/20/2014   HCT 46.0 04/20/2014   MCV 89.5 04/20/2014   PLT 210 04/20/2014    Recent Labs Lab 04/20/14 2327  NA 142  K 4.9  CL 106  CO2 24  BUN 14  CREATININE 1.02  CALCIUM 9.8  GLUCOSE 95   No results found for: PTT Lab Results  Component Value Date   INR 0.99 07/14/2010   Lab Results  Component Value Date   TROPONINI 0.04*  04/21/2014    No results found for: CHOL No results found for: HDL No results found for: LDLCALC No results found for: TRIG No results found for: CHOLHDL No results found for: LDLDIRECT    Radiology:  Dg Chest 2 View  04/21/2014   CLINICAL DATA:  Hypertension and chest pain  EXAM: CHEST  2 VIEW  COMPARISON:  06/02/2013  FINDINGS: Mild cardiomegaly with aortic tortuosity/enlargement - no interval change. There is no edema, consolidation, effusion, or pneumothorax. No acute osseous findings to explain chest pain.  IMPRESSION: 1. No acute findings. 2. Unchanged appearance of the mediastinum in this patient with history of thoracic aortic aneurysm.   Electronically Signed   By: Monte Fantasia M.D.   On: 04/21/2014 01:08  Ct Head Wo Contrast  04/21/2014   CLINICAL DATA:  Hypertension and headache.  EXAM: CT HEAD WITHOUT CONTRAST  TECHNIQUE: Contiguous axial images were obtained from the base of the skull through the vertex without intravenous contrast.  COMPARISON:  07/14/2010  FINDINGS: Skull and Sinuses:Negative for fracture or destructive process. Small nonobstructive polyps or mucous retention cysts in the left maxillary sinus.  Orbits: Bilateral cataract resection.  Brain: No evidence of acute infarction, hemorrhage, hydrocephalus, or mass lesion/mass effect. Dilated perivascular space noted below the left putamen.  IMPRESSION: Negative head CT.   Electronically Signed   By: Monte Fantasia M.D.   On: 04/21/2014 02:36   Ct Angio Chest Pe W/cm &/or Wo Cm  04/21/2014   CLINICAL DATA:  Hypertension and chest pain  EXAM: CT ANGIOGRAPHY CHEST WITH CONTRAST  TECHNIQUE: Multidetector CT imaging of the chest was performed using the standard protocol during bolus administration of intravenous contrast. Multiplanar CT image reconstructions and MIPs were obtained to evaluate the vascular anatomy.  CONTRAST:  189mL OMNIPAQUE IOHEXOL 350 MG/ML SOLN  COMPARISON:  03/03/2014  FINDINGS: THORACIC INLET/BODY WALL:  No  acute abnormality.  MEDIASTINUM:  Normal heart size. No pericardial effusion. No acute vascular abnormality, including pulmonary embolism. There is atherosclerosis, including the coronary arteries, present throughout the left main and LAD. 4 cm fusiform aneurysmal enlargement of the proximal descending aorta, unchanged from approximately 2 months ago. No indication of dissection or intramural hematoma. No adenopathy.  LUNG WINDOWS:  Clustering of tiny pulmonary nodules in left upper lobe has developed from approximately 2 months ago and is most consistent with an inflammatory process. There is no suspected pneumonia.  UPPER ABDOMEN:  No acute findings.  OSSEOUS:  No acute fracture.  No suspicious lytic or blastic lesions.  Review of the MIP images confirms the above findings.  IMPRESSION: 1. Negative for pulmonary embolism or other acute intrathoracic finding. 2. 4 cm descending thoracic aortic aneurysm. Recommend semi-annual imaging followup by CTA or MRA and referral to cardiothoracic surgery if not already obtained. This recommendation follows 2010 ACCF/AHA/AATS/ACR/ASA/SCA/SCAI/SIR/STS/SVM Guidelines for the Diagnosis and Management of Patients With Thoracic Aortic Disease. Circulation. 2010; 121: e266-e36 3. Coronary atherosclerosis.   Electronically Signed   By: Monte Fantasia M.D.   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

## 2014-04-21 NOTE — CV Procedure (Signed)
      Cardiac Catheterization Operative Report  Geoffrey [name] [id number] 4/8/20165:21 PM [name], MD  Procedure Performed:  1. Left Heart Catheterization 2. Selective Coronary Angiography 3. Left ventricular angiogram  Operator: Lauree Chandler, MD  Arterial access site:  Right radial artery.   Indication: 64 yo male with history of HTN, bicuspid aortic valve with AI, AAA admitted with chest pain. Troponin has subtle elevation. (0.04 and then negative). No recurrent pain.                                        Procedure Details: The risks, benefits, complications, treatment options, and expected outcomes were discussed with the patient. The patient and/or family concurred with the proposed plan, giving informed consent. The patient was brought to the cath lab after IV hydration was begun and oral premedication was given. The patient was further sedated with Versed and Fentanyl. The right wrist was assessed with a modified Allens test which was positive. The right wrist was prepped and draped in a sterile fashion. 1% lidocaine was used for local anesthesia. Using the modified Seldinger access technique, a 5 French sheath was placed in the right radial artery. 3 mg Verapamil was given through the sheath. 5000 units IV heparin was given. Standard diagnostic catheters were used to perform selective coronary angiography. A JL5 catheter was used to engage the left main. A pigtail catheter was used to perform a left ventricular angiogram. The sheath was removed from the right radial artery and a Terumo hemostasis band was applied at the arteriotomy site on the right wrist.    There were no immediate complications. The patient was taken to the recovery area in stable condition.   Hemodynamic Findings: Central aortic pressure: 154/77 Left ventricular pressure: 150/6/10  Angiographic Findings:  Left main: No obstructive disease.   Left Anterior Descending Artery: Large caliber  vessel that courses to the apex. There are two moderate caliber diagonal branches. No obstructive disease.   Circumflex Artery: Large caliber vessel with small first OM branch and large distal OM branch. No obstructive disease.   Right Coronary Artery: Large dominant vessel with no obstructive disease.   Left Ventricular Angiogram: LVEF=55-60%.   Impression: 1. No angiographic evidence of CAD 2. Normal LV systolic function 3. Non-cardiac chest pain  Recommendations: No further ischemic evaluation       Complications:  None. The patient tolerated the procedure well.

## 2014-04-21 NOTE — Progress Notes (Signed)
Progress Note   Geoffrey [name] RWE:[id number] DOB: 1950/10/01 DOA: 04/20/2014 PCP: [name], MD   Brief Narrative:   Geoffrey [name] is an 64 y.o. male with a PMH of RCC s/p resection, bicuspid aortic valve/aortic root dilatation who was admitted 04/20/14 with chief complaint of chest pain relieved with nitroglycerin and morphine. Initial blood pressure was 220/100. While in the ER, the patient spiked a fever of over 101. He reports he had 5 teeth extracted a few days prior to admission, and was put on a seven-day course of amoxicillin preoperatively.  Assessment/Plan:   Principal Problem:   Chest pain - Being monitored on telemetry. First troponin 0.04. Heart scores 4. - EKG negative for ischemic changes, +Q waves in V1.  Seen by cardiologist with plans to pursue heart catheterization. - CT angiogram negative for pulmonary embolism.  Active Problems:   Descending AAA - We'll need semi-annual imaging to ensure stability.    Aortic valve disorder/Dilated aortic root - Control blood pressure.    Hypertensive urgency - Continue Cozaar, Toprol-XL, and as needed hydralazine.    Fever status post recent extraction of 5 teeth - Amoxicillin resumed. Continue Peridex mouth rinse.    DVT Prophylaxis - Continue subcutaneous heparin.  Code Status: Full. Family Communication: No family at bedside. Disposition Plan: Home when stable.   IV Access:    Peripheral IV   Procedures and diagnostic studies:   Dg Chest 2 View  04/21/2014   CLINICAL DATA:  Hypertension and chest pain  EXAM: CHEST  2 VIEW  COMPARISON:  06/02/2013  FINDINGS: Mild cardiomegaly with aortic tortuosity/enlargement - no interval change. There is no edema, consolidation, effusion, or pneumothorax. No acute osseous findings to explain chest pain.  IMPRESSION: 1. No acute findings. 2. Unchanged appearance of the mediastinum in this patient with history of thoracic aortic aneurysm.   Electronically Signed   By:  Monte Fantasia M.D.   On: 04/21/2014 01:08   Ct Head Wo Contrast  04/21/2014   CLINICAL DATA:  Hypertension and headache.  EXAM: CT HEAD WITHOUT CONTRAST  TECHNIQUE: Contiguous axial images were obtained from the base of the skull through the vertex without intravenous contrast.  COMPARISON:  07/14/2010  FINDINGS: Skull and Sinuses:Negative for fracture or destructive process. Small nonobstructive polyps or mucous retention cysts in the left maxillary sinus.  Orbits: Bilateral cataract resection.  Brain: No evidence of acute infarction, hemorrhage, hydrocephalus, or mass lesion/mass effect. Dilated perivascular space noted below the left putamen.  IMPRESSION: Negative head CT.   Electronically Signed   By: Monte Fantasia M.D.   On: 04/21/2014 02:36   Ct Angio Chest Pe W/cm &/or Wo Cm  04/21/2014   CLINICAL DATA:  Hypertension and chest pain  EXAM: CT ANGIOGRAPHY CHEST WITH CONTRAST  TECHNIQUE: Multidetector CT imaging of the chest was performed using the standard protocol during bolus administration of intravenous contrast. Multiplanar CT image reconstructions and MIPs were obtained to evaluate the vascular anatomy.  CONTRAST:  125mL OMNIPAQUE IOHEXOL 350 MG/ML SOLN  COMPARISON:  03/03/2014  FINDINGS: THORACIC INLET/BODY WALL:  No acute abnormality.  MEDIASTINUM:  Normal heart size. No pericardial effusion. No acute vascular abnormality, including pulmonary embolism. There is atherosclerosis, including the coronary arteries, present throughout the left main and LAD. 4 cm fusiform aneurysmal enlargement of the proximal descending aorta, unchanged from approximately 2 months ago. No indication of dissection or intramural hematoma. No adenopathy.  LUNG WINDOWS:  Clustering of tiny pulmonary nodules in left  upper lobe has developed from approximately 2 months ago and is most consistent with an inflammatory process. There is no suspected pneumonia.  UPPER ABDOMEN:  No acute findings.  OSSEOUS:  No acute fracture.   No suspicious lytic or blastic lesions.  Review of the MIP images confirms the above findings.  IMPRESSION: 1. Negative for pulmonary embolism or other acute intrathoracic finding. 2. 4 cm descending thoracic aortic aneurysm. Recommend semi-annual imaging followup by CTA or MRA and referral to cardiothoracic surgery if not already obtained. This recommendation follows 2010 ACCF/AHA/AATS/ACR/ASA/SCA/SCAI/SIR/STS/SVM Guidelines for the Diagnosis and Management of Patients With Thoracic Aortic Disease. Circulation. 2010; 121: e266-e36 3. Coronary atherosclerosis.   Electronically Signed   By: Monte Fantasia M.D.   On: 04/21/2014 02:53     Medical Consultants:    Dr. Pernell Dupre, Cardiology.  Anti-Infectives:    None.  Subjective:   [name] tells me he had high BP last pm and came to the ER to have BP elevation addressed, and developed substernal chest pain radiating down left arm associated with dyspnea, but has not had any chest pain since around 2 am.  No nausea.    Objective:    Filed Vitals:   04/21/14 0250 04/21/14 0537 04/21/14 0549 04/21/14 0550  BP: 173/79 154/77  146/53  Pulse: 75 70  72  Temp: 98.7 F (37.1 C) 98.3 F (36.8 C)  98.2 F (36.8 C)  TempSrc: Oral   Oral  Resp: 20 18  18   Height:   5\' 9"  (1.753 m)   Weight:   106.9 kg (235 lb 10.8 oz)   SpO2: 99% 100%  97%   No intake or output data in the 24 hours ending 04/21/14 0759  Exam: Gen:  NAD Cardiovascular:  RRR, II/VI murmur Respiratory:  Lungs CTAB Gastrointestinal:  Abdomen soft, NT/ND, + BS Extremities:  No C/E/C   Data Reviewed:    Labs: Basic Metabolic Panel:  Recent Labs Lab 04/20/14 2327  NA 142  K 4.9  CL 106  CO2 24  GLUCOSE 95  BUN 14  CREATININE 1.02  CALCIUM 9.8   GFR Estimated Creatinine Clearance: 90.5 mL/min (by C-G formula based on Cr of 1.02). Liver Function Tests: No results for input(s): AST, ALT, ALKPHOS, BILITOT, PROT, ALBUMIN in the last 168 hours. No results  for input(s): LIPASE, AMYLASE in the last 168 hours. No results for input(s): AMMONIA in the last 168 hours. Coagulation profile No results for input(s): INR, PROTIME in the last 168 hours.  CBC:  Recent Labs Lab 04/20/14 2327  WBC 7.8  HGB 14.7  HCT 46.0  MCV 89.5  PLT 210   Cardiac Enzymes:  Recent Labs Lab 04/21/14 0508  TROPONINI 0.04*    Microbiology No results found for this or any previous visit (from the past 240 hour(s)).   Medications:   . amoxicillin  500 mg Oral TID  . aspirin EC  81 mg Oral Daily  . chlorhexidine  15 mL Mouth/Throat BID  . fluticasone  2 spray Each Nare Daily  . heparin  5,000 Units Subcutaneous 3 times per day  . losartan  50 mg Oral Daily  . metoprolol succinate  12.5 mg Oral BID  . rosuvastatin  40 mg Oral Daily   Continuous Infusions:   Time spent: 35 minutes with > 50% of time discussing current diagnostic test results, clinical impression and plan of care.      [facility] Pager [phone]. If unable to  reach me by pager, please call my cell phone at [phone].  *Please refer to [email or web address], password [email or web address] to get updated schedule on who will round on this patient, as hospitalists switch teams weekly. If 7PM-7AM, please contact night-coverage at [email or web address], password [email or web address] for any overnight needs.  04/21/2014, 7:59 AM

## 2014-04-21 NOTE — Interval H&P Note (Signed)
History and Physical Interval Note:  04/21/2014 4:23 PM  Geoffrey [name]  has presented today for cardiac cath with the diagnosis of unstable angina. The various methods of treatment have been discussed with the patient and family. After consideration of risks, benefits and other options for treatment, the patient has consented to  Procedure(s): LEFT HEART CATHETERIZATION WITH CORONARY ANGIOGRAM (N/A) as a surgical intervention .  The patient's history has been reviewed, patient examined, no change in status, stable for surgery.  I have reviewed the patient's chart and labs.  Questions were answered to the patient's satisfaction.    Cath Lab Visit (complete for each Cath Lab visit)  Clinical Evaluation Leading to the Procedure:   ACS: Yes.    Non-ACS:    Anginal Classification: CCS III  Anti-ischemic medical therapy: Minimal Therapy (1 class of medications)  Non-Invasive Test Results: No non-invasive testing performed  Prior CABG: No previous CABG         Geoffrey [name]

## 2014-04-21 NOTE — ED Provider Notes (Signed)
CSN: [id number]     Arrival date & time 04/20/14  2252 History   First MD Initiated Contact with Patient 04/21/14 0018     Chief Complaint  Patient presents with  . Hypertension  . Chest Pain     (Consider location/radiation/quality/duration/timing/severity/associated sxs/prior Treatment) Patient is a 64 y.o. male presenting with hypertension and chest pain. The history is provided by the patient. No language interpreter was used.  Hypertension This is a chronic problem. The current episode started more than 1 week ago. The problem occurs constantly. The problem has been gradually worsening. Associated symptoms include chest pain, headaches and shortness of breath. Pertinent negatives include no abdominal pain. Nothing aggravates the symptoms. Nothing relieves the symptoms. He has tried nothing for the symptoms. The treatment provided no relief.  Chest Pain Pain location:  Substernal area Pain quality: dull   Pain radiates to:  L shoulder Pain radiates to the back: no   Pain severity:  Moderate Onset quality:  Sudden Timing:  Constant Progression:  Unchanged Chronicity:  New Context: at rest   Context: not breathing   Relieved by:  Nothing Worsened by:  Nothing tried Ineffective treatments:  None tried Associated symptoms: fever, headache and shortness of breath   Associated symptoms: no abdominal pain   Risk factors: aortic disease, hypertension and male sex     Past Medical History  Diagnosis Date  . Hyperkalemia   . Dyslipidemia   . Gastric ulcer   . Renal cell cancer   . PAC (premature atrial contraction)   . Dilated aortic root   . HTN (hypertension)   . Heart murmur   . Arthritis   . Kidney stone   . Aortic regurgitation   . Aortic aneurysm    Past Surgical History  Procedure Laterality Date  . Cataract extraction    . Kidney surgery Left     partial nephrectomy  . Knee surgery    . Tonsillectomy    . Knee arthroscopy with medial menisectomy Left 04/07/2013     Procedure: LEFT KNEE ARTHROSCOPY WITH MEDIAL MENISCECTOMY ABRASION CHONDROPLASTY OF THE PATELLA  AND MEDIAL FEMORAL CONDYLE, MICROFRACTURE TECHNIQUE OF THE MEDIAL FEMORAL CONDYLE ;  Surgeon: Tobi Bastos, MD;  Location: [facility];  Service: Orthopedics;  Laterality: Left;  . Back surgery  2012    lumbar  . Cystoscopy with retrograde pyelogram, ureteroscopy and stent placement Left 08/04/2013    Procedure: CYSTOSCOPY, URETEROSCOPY, RETROGRADES;  Surgeon: Raynelle Bring, MD;  Location: [facility];  Service: Urology;  Laterality: Left;  . Holmium laser application Left 2/35/3614    Procedure: HOLMIUM LASER APPLICATION;  Surgeon: Raynelle Bring, MD;  Location: [facility];  Service: Urology;  Laterality: Left;   Family History  Problem Relation Age of Onset  . Hypertension Father   . Lung cancer Father   . Dementia Mother   . Hypertension Sister   . Breast cancer Sister    History  Substance Use Topics  . Smoking status: Former Smoker    Quit date: 02/16/1978  . Smokeless tobacco: Never Used  . Alcohol Use: Yes     Comment: 2-3/ week    Review of Systems  Constitutional: Positive for fever.  Respiratory: Positive for shortness of breath.   Cardiovascular: Positive for chest pain.  Gastrointestinal: Negative for abdominal pain.  Neurological: Positive for headaches.  All other systems reviewed and are negative.     Allergies  Zetia and Shrimp  Home Medications   Prior to Admission medications  Medication Sig Start Date End Date Taking? Authorizing Provider  amoxicillin (AMOXIL) 500 MG tablet Take 1 tablet by mouth 3 (three) times daily. For 7 days 04/13/14  Yes Historical Provider, MD  aspirin EC 81 MG tablet Take 81 mg by mouth daily.   Yes Historical Provider, MD  chlorhexidine (PERIDEX) 0.12 % solution 15 mLs. Swish for 30 sec then spit twice daily 04/13/14  Yes Historical Provider, MD  losartan (COZAAR) 50 MG tablet Take 50 mg by mouth daily.   Yes Historical Provider, MD   metoprolol succinate (TOPROL-XL) 25 MG 24 hr tablet Take 12.5 mg by mouth 2 (two) times daily.   Yes Historical Provider, MD  Omega-3 Fatty Acids (FISH OIL PO) Take 1,000 mg by mouth 2 (two) times daily.    Yes Historical Provider, MD  oxyCODONE-acetaminophen (PERCOCET) 10-325 MG per tablet Take 1 tablet by mouth every 4 (four) hours as needed. for pain 04/13/14  Yes Historical Provider, MD  rosuvastatin (CRESTOR) 40 MG tablet Take 40 mg by mouth daily.   Yes Historical Provider, MD  triamcinolone (NASACORT ALLERGY 24HR) 55 MCG/ACT AERO nasal inhaler Place 1 spray into the nose daily.   Yes Historical Provider, MD  AZOR 10-40 MG per tablet TAKE 1 TABLET BY MOUTH EVERY EVENING. Patient not taking: Reported on 04/20/2014 12/05/13   Sueanne Margarita, MD  CRESTOR 40 MG tablet TAKE 1 TABLET (40 MG TOTAL) BY MOUTH EVERY EVENING. Patient not taking: Reported on 04/20/2014 12/26/13   Minus Breeding, MD  metoprolol tartrate (LOPRESSOR) 25 MG tablet TAKE 0.5 TABLETS (12.5 MG TOTAL) BY MOUTH 2 (TWO) TIMES DAILY. Patient not taking: Reported on 04/20/2014 03/03/14   Sueanne Margarita, MD   BP 173/79 mmHg  Pulse 75  Temp(Src) 98.7 F (37.1 C) (Oral)  Resp 20  SpO2 99% Physical Exam  Constitutional: He is oriented to person, place, and time. He appears well-developed and well-nourished.  HENT:  Head: Normocephalic and atraumatic.  Mouth/Throat: Oropharynx is clear and moist.  Eyes: Conjunctivae and EOM are normal. Pupils are equal, round, and reactive to light.  Neck: Normal range of motion. Neck supple.  Cardiovascular: Normal rate, regular rhythm and intact distal pulses.   Pulmonary/Chest: Effort normal and breath sounds normal. No respiratory distress. He has no wheezes (.muse). He has no rales.  Abdominal: Soft. Bowel sounds are normal. There is no tenderness. There is no rebound and no guarding.  Musculoskeletal: Normal range of motion.  Neurological: He is alert and oriented to person, place, and time.   Skin: Skin is warm and dry.  Psychiatric: He has a normal mood and affect.    ED Course  Procedures (including critical care time) Labs Review Labs Reviewed  BASIC METABOLIC PANEL - Abnormal; Notable for the following:    GFR calc non Af Amer 77 (*)    GFR calc Af Amer 89 (*)    All other components within normal limits  CBC  BRAIN NATRIURETIC PEPTIDE  I-STAT TROPOININ, ED    Imaging Review Dg Chest 2 View  04/21/2014   CLINICAL DATA:  Hypertension and chest pain  EXAM: CHEST  2 VIEW  COMPARISON:  06/02/2013  FINDINGS: Mild cardiomegaly with aortic tortuosity/enlargement - no interval change. There is no edema, consolidation, effusion, or pneumothorax. No acute osseous findings to explain chest pain.  IMPRESSION: 1. No acute findings. 2. Unchanged appearance of the mediastinum in this patient with history of thoracic aortic aneurysm.   Electronically Signed   By: Angelica Chessman  Watts M.D.   On: 04/21/2014 01:08   Ct Head Wo Contrast  04/21/2014   CLINICAL DATA:  Hypertension and headache.  EXAM: CT HEAD WITHOUT CONTRAST  TECHNIQUE: Contiguous axial images were obtained from the base of the skull through the vertex without intravenous contrast.  COMPARISON:  07/14/2010  FINDINGS: Skull and Sinuses:Negative for fracture or destructive process. Small nonobstructive polyps or mucous retention cysts in the left maxillary sinus.  Orbits: Bilateral cataract resection.  Brain: No evidence of acute infarction, hemorrhage, hydrocephalus, or mass lesion/mass effect. Dilated perivascular space noted below the left putamen.  IMPRESSION: Negative head CT.   Electronically Signed   By: Monte Fantasia M.D.   On: 04/21/2014 02:36   Ct Angio Chest Pe W/cm &/or Wo Cm  04/21/2014   CLINICAL DATA:  Hypertension and chest pain  EXAM: CT ANGIOGRAPHY CHEST WITH CONTRAST  TECHNIQUE: Multidetector CT imaging of the chest was performed using the standard protocol during bolus administration of intravenous contrast.  Multiplanar CT image reconstructions and MIPs were obtained to evaluate the vascular anatomy.  CONTRAST:  13mL OMNIPAQUE IOHEXOL 350 MG/ML SOLN  COMPARISON:  03/03/2014  FINDINGS: THORACIC INLET/BODY WALL:  No acute abnormality.  MEDIASTINUM:  Normal heart size. No pericardial effusion. No acute vascular abnormality, including pulmonary embolism. There is atherosclerosis, including the coronary arteries, present throughout the left main and LAD. 4 cm fusiform aneurysmal enlargement of the proximal descending aorta, unchanged from approximately 2 months ago. No indication of dissection or intramural hematoma. No adenopathy.  LUNG WINDOWS:  Clustering of tiny pulmonary nodules in left upper lobe has developed from approximately 2 months ago and is most consistent with an inflammatory process. There is no suspected pneumonia.  UPPER ABDOMEN:  No acute findings.  OSSEOUS:  No acute fracture.  No suspicious lytic or blastic lesions.  Review of the MIP images confirms the above findings.  IMPRESSION: 1. Negative for pulmonary embolism or other acute intrathoracic finding. 2. 4 cm descending thoracic aortic aneurysm. Recommend semi-annual imaging followup by CTA or MRA and referral to cardiothoracic surgery if not already obtained. This recommendation follows 2010 ACCF/AHA/AATS/ACR/ASA/SCA/SCAI/SIR/STS/SVM Guidelines for the Diagnosis and Management of Patients With Thoracic Aortic Disease. Circulation. 2010; 121: e266-e36 3. Coronary atherosclerosis.   Electronically Signed   By: Monte Fantasia M.D.   On: 04/21/2014 02:53     EKG Interpretation None      MDM   Final diagnoses:  None   Results for orders placed or performed during the hospital encounter of 04/20/14  CBC  Result Value Ref Range   WBC 7.8 4.0 - 10.5 K/uL   RBC 5.14 4.22 - 5.81 MIL/uL   Hemoglobin 14.7 13.0 - 17.0 g/dL   HCT 46.0 39.0 - 52.0 %   MCV 89.5 78.0 - 100.0 fL   MCH 28.6 26.0 - 34.0 pg   MCHC 32.0 30.0 - 36.0 g/dL   RDW 14.0  11.5 - 15.5 %   Platelets 210 150 - 400 K/uL  Basic metabolic panel  Result Value Ref Range   Sodium 142 135 - 145 mmol/L   Potassium 4.9 3.5 - 5.1 mmol/L   Chloride 106 96 - 112 mmol/L   CO2 24 19 - 32 mmol/L   Glucose, Bld 95 70 - 99 mg/dL   BUN 14 6 - 23 mg/dL   Creatinine, Ser 1.02 0.50 - 1.35 mg/dL   Calcium 9.8 8.4 - 10.5 mg/dL   GFR calc non Af Amer 77 (L) >90 mL/min  GFR calc Af Amer 89 (L) >90 mL/min   Anion gap 12 5 - 15  BNP (order ONLY if patient complains of dyspnea/SOB AND you have documented it for THIS visit)  Result Value Ref Range   B Natriuretic Peptide 90.1 0.0 - 100.0 pg/mL  I-stat troponin, ED (not at [facility])  Result Value Ref Range   Troponin i, poc 0.02 0.00 - 0.08 ng/mL   Comment 3           Dg Chest 2 View  04/21/2014   CLINICAL DATA:  Hypertension and chest pain  EXAM: CHEST  2 VIEW  COMPARISON:  06/02/2013  FINDINGS: Mild cardiomegaly with aortic tortuosity/enlargement - no interval change. There is no edema, consolidation, effusion, or pneumothorax. No acute osseous findings to explain chest pain.  IMPRESSION: 1. No acute findings. 2. Unchanged appearance of the mediastinum in this patient with history of thoracic aortic aneurysm.   Electronically Signed   By: Monte Fantasia M.D.   On: 04/21/2014 01:08   Ct Head Wo Contrast  04/21/2014   CLINICAL DATA:  Hypertension and headache.  EXAM: CT HEAD WITHOUT CONTRAST  TECHNIQUE: Contiguous axial images were obtained from the base of the skull through the vertex without intravenous contrast.  COMPARISON:  07/14/2010  FINDINGS: Skull and Sinuses:Negative for fracture or destructive process. Small nonobstructive polyps or mucous retention cysts in the left maxillary sinus.  Orbits: Bilateral cataract resection.  Brain: No evidence of acute infarction, hemorrhage, hydrocephalus, or mass lesion/mass effect. Dilated perivascular space noted below the left putamen.  IMPRESSION: Negative head CT.   Electronically Signed   By:  Monte Fantasia M.D.   On: 04/21/2014 02:36   Ct Angio Chest Pe W/cm &/or Wo Cm  04/21/2014   CLINICAL DATA:  Hypertension and chest pain  EXAM: CT ANGIOGRAPHY CHEST WITH CONTRAST  TECHNIQUE: Multidetector CT imaging of the chest was performed using the standard protocol during bolus administration of intravenous contrast. Multiplanar CT image reconstructions and MIPs were obtained to evaluate the vascular anatomy.  CONTRAST:  156mL OMNIPAQUE IOHEXOL 350 MG/ML SOLN  COMPARISON:  03/03/2014  FINDINGS: THORACIC INLET/BODY WALL:  No acute abnormality.  MEDIASTINUM:  Normal heart size. No pericardial effusion. No acute vascular abnormality, including pulmonary embolism. There is atherosclerosis, including the coronary arteries, present throughout the left main and LAD. 4 cm fusiform aneurysmal enlargement of the proximal descending aorta, unchanged from approximately 2 months ago. No indication of dissection or intramural hematoma. No adenopathy.  LUNG WINDOWS:  Clustering of tiny pulmonary nodules in left upper lobe has developed from approximately 2 months ago and is most consistent with an inflammatory process. There is no suspected pneumonia.  UPPER ABDOMEN:  No acute findings.  OSSEOUS:  No acute fracture.  No suspicious lytic or blastic lesions.  Review of the MIP images confirms the above findings.  IMPRESSION: 1. Negative for pulmonary embolism or other acute intrathoracic finding. 2. 4 cm descending thoracic aortic aneurysm. Recommend semi-annual imaging followup by CTA or MRA and referral to cardiothoracic surgery if not already obtained. This recommendation follows 2010 ACCF/AHA/AATS/ACR/ASA/SCA/SCAI/SIR/STS/SVM Guidelines for the Diagnosis and Management of Patients With Thoracic Aortic Disease. Circulation. 2010; 121: e266-e36 3. Coronary atherosclerosis.   Electronically Signed   By: Monte Fantasia M.D.   On: 04/21/2014 02:53    Medications  ketorolac (TORADOL) 30 MG/ML injection 30 mg (not  administered)  cefTRIAXone (ROCEPHIN) 1 g in dextrose 5 % 50 mL IVPB (not administered)  iohexol (OMNIPAQUE) 350 MG/ML injection  100 mL (100 mLs Intravenous Contrast Given 04/21/14 0224)  metoCLOPramide (REGLAN) injection 10 mg (10 mg Intravenous Given 04/21/14 0400)  diphenhydrAMINE (BENADRYL) injection 12.5 mg (12.5 mg Intravenous Given 04/21/14 0400)  gi cocktail (Maalox,Lidocaine,Donnatal) (30 mLs Oral Given 04/21/14 0400)        EKG Interpretation  Date/Time:  Thursday Gerron Guidotti 07 2016 23:17:36 EDT Ventricular Rate:  84 PR Interval:  147 QRS Duration: 84 QT Interval:  368 QTC Calculation: 435 R Axis:   7 Text Interpretation:  Sinus rhythm Consider anterior infarct Borderline repolarization abnormality Baseline wander in lead(s) V1 Confirmed by [facility]  MD, Kenyanna Grzesiak ([id number]) on 04/21/2014 3:26:13 AM      Will need chest pain rule out.  No bleed in the head will place in obs tele per Dr.  Alcario Drought  Veatrice Kells, MD 04/21/14 [id number]

## 2014-04-21 NOTE — ED Notes (Signed)
Pt. On cardiac monitor. 

## 2014-04-21 NOTE — Progress Notes (Signed)
UR completed 

## 2014-04-21 NOTE — H&P (View-Only) (Signed)
Name: Geoffrey [name] is a 64 y.o. male Admit date: 04/20/2014 Referring Physician:  Alcario Drought, M.D. Primary Physician:  [name], M.D. Primary Cardiologist:  Fransico Him, M.D.  Reason for Consultation:  Prolonged chest and left shoulder discomfort with minimally elevated troponin  ASSESSMENT:  1. Chest pain with minimal increase in troponin 2. Coronary atherosclerosis based on CT findings with three-vessel calcification  3. Descending aortic dilatation, and abdominal aortic aneurysm  4.  Bicuspid Aortic Valve with regurgitation, moderate 5. Hypertension  PLAN:  1. Under the circumstances, I have recommended that the patient undergo coronary angiography to exclude the possibility of left anterior descending disease (EKG as long shown evidence of an anterior infarction. Perhaps it is totally occluded and collateralized). The procedure and risk of coronary angiography were discussed in detail with the patient, including the possibility of stroke, death, myocardial infarction, kidney injury, bleeding, allergy, and surgery. Patient understands and accepts these risks. 2. Control blood pressure 3. Risk factor modification 4. Continued observation of descending and abdominal aortic enlargement 6. Renal cell cancer status post partial nephrectomy  HPI: 64 year old gentleman with a history of aortic valve disease, hypertension, abdominal aortic aneurysm, and dilated ascending aorta. He was admitted with the sudden onset of left precordial discomfort that radiated into the left shoulder. The troponin is minimally elevated. He has never before had such chest discomfort. He did have a normal myocardial perfusion study in 2012 but it is unclear to me if the EKG evidence of anterior infarction was present at that time.  PMH:   Past Medical History  Diagnosis Date  . Hyperkalemia   . Dyslipidemia   . Gastric ulcer   . Renal cell cancer   . PAC (premature atrial contraction)   . Dilated aortic  root   . HTN (hypertension)   . Heart murmur   . Arthritis   . Kidney stone   . Aortic regurgitation   . Aortic aneurysm     PSH:   Past Surgical History  Procedure Laterality Date  . Cataract extraction    . Kidney surgery Left     partial nephrectomy  . Knee surgery    . Tonsillectomy    . Knee arthroscopy with medial menisectomy Left 04/07/2013    Procedure: LEFT KNEE ARTHROSCOPY WITH MEDIAL MENISCECTOMY ABRASION CHONDROPLASTY OF THE PATELLA  AND MEDIAL FEMORAL CONDYLE, MICROFRACTURE TECHNIQUE OF THE MEDIAL FEMORAL CONDYLE ;  Surgeon: Tobi Bastos, MD;  Location: [facility];  Service: Orthopedics;  Laterality: Left;  . Back surgery  2012    lumbar  . Cystoscopy with retrograde pyelogram, ureteroscopy and stent placement Left 08/04/2013    Procedure: CYSTOSCOPY, URETEROSCOPY, RETROGRADES;  Surgeon: Raynelle Bring, MD;  Location: [facility];  Service: Urology;  Laterality: Left;  . Holmium laser application Left 6/81/2751    Procedure: HOLMIUM LASER APPLICATION;  Surgeon: Raynelle Bring, MD;  Location: [facility];  Service: Urology;  Laterality: Left;   Allergies:  Zetia and Shrimp Prior to Admit Meds:   Prescriptions prior to admission  Medication Sig Dispense Refill Last Dose  . amoxicillin (AMOXIL) 500 MG tablet Take 1 tablet by mouth 3 (three) times daily. For 7 days  0 04/20/2014 at Unknown time  . aspirin EC 81 MG tablet Take 81 mg by mouth daily.   04/20/2014 at Unknown time  . chlorhexidine (PERIDEX) 0.12 % solution 15 mLs. Swish for 30 sec then spit twice daily  0 04/20/2014 at Unknown time  . losartan (COZAAR) 50 MG tablet  Take 50 mg by mouth daily.   04/20/2014 at Unknown time  . metoprolol succinate (TOPROL-XL) 25 MG 24 hr tablet Take 12.5 mg by mouth 2 (two) times daily.   04/20/2014 at 2000  . Omega-3 Fatty Acids (FISH OIL PO) Take 1,000 mg by mouth 2 (two) times daily.    04/20/2014 at Unknown time  . oxyCODONE-acetaminophen (PERCOCET) 10-325 MG per tablet Take 1 tablet by mouth every 4  (four) hours as needed. for pain  0 04/20/2014 at 0700  . rosuvastatin (CRESTOR) 40 MG tablet Take 40 mg by mouth daily.   04/20/2014 at Unknown time  . triamcinolone (NASACORT ALLERGY 24HR) 55 MCG/ACT AERO nasal inhaler Place 1 spray into the nose daily.   04/20/2014 at Unknown time   Fam HX:    Family History  Problem Relation Age of Onset  . Hypertension Father   . Lung cancer Father   . Dementia Mother   . Hypertension Sister   . Breast cancer Sister    Social HX:    History   Social History  . Marital Status: Married    Spouse Name: N/A  . Number of Children: N/A  . Years of Education: N/A   Occupational History  . Not on file.   Social History Main Topics  . Smoking status: Former Smoker    Quit date: 02/16/1978  . Smokeless tobacco: Never Used  . Alcohol Use: Yes     Comment: 2-3/ week  . Drug Use: No  . Sexual Activity: Not on file   Other Topics Concern  . Not on file   Social History Narrative     Review of Systems: Denies claudication. No history of CVA.  Physical Exam: Blood pressure 146/53, pulse 72, temperature 98.2 F (36.8 C), temperature source Oral, resp. rate 18, height 5\' 9"  (1.753 m), weight 235 lb 10.8 oz (106.9 kg), SpO2 97 %. Weight change:    Lying flat in bed Chest is clear No carotid bruits Extremities reveal 2+ and symmetric pulses radials, femorals, and posterior tibials bilaterally. Cardiac exam reveals a grade 2 to 3/6 decrescendo low pain aortic regurgitation murmur Abdomen is soft Neurological exam is normal   Labs: Lab Results  Component Value Date   WBC 7.8 04/20/2014   HGB 14.7 04/20/2014   HCT 46.0 04/20/2014   MCV 89.5 04/20/2014   PLT 210 04/20/2014    Recent Labs Lab 04/20/14 2327  NA 142  K 4.9  CL 106  CO2 24  BUN 14  CREATININE 1.02  CALCIUM 9.8  GLUCOSE 95   No results found for: PTT Lab Results  Component Value Date   INR 0.99 07/14/2010   Lab Results  Component Value Date   TROPONINI 0.04*  04/21/2014    No results found for: CHOL No results found for: HDL No results found for: LDLCALC No results found for: TRIG No results found for: CHOLHDL No results found for: LDLDIRECT    Radiology:  Dg Chest 2 View  04/21/2014   CLINICAL DATA:  Hypertension and chest pain  EXAM: CHEST  2 VIEW  COMPARISON:  06/02/2013  FINDINGS: Mild cardiomegaly with aortic tortuosity/enlargement - no interval change. There is no edema, consolidation, effusion, or pneumothorax. No acute osseous findings to explain chest pain.  IMPRESSION: 1. No acute findings. 2. Unchanged appearance of the mediastinum in this patient with history of thoracic aortic aneurysm.   Electronically Signed   By: Monte Fantasia M.D.   On: 04/21/2014 01:08  Ct Head Wo Contrast  04/21/2014   CLINICAL DATA:  Hypertension and headache.  EXAM: CT HEAD WITHOUT CONTRAST  TECHNIQUE: Contiguous axial images were obtained from the base of the skull through the vertex without intravenous contrast.  COMPARISON:  07/14/2010  FINDINGS: Skull and Sinuses:Negative for fracture or destructive process. Small nonobstructive polyps or mucous retention cysts in the left maxillary sinus.  Orbits: Bilateral cataract resection.  Brain: No evidence of acute infarction, hemorrhage, hydrocephalus, or mass lesion/mass effect. Dilated perivascular space noted below the left putamen.  IMPRESSION: Negative head CT.   Electronically Signed   By: Monte Fantasia M.D.   On: 04/21/2014 02:36   Ct Angio Chest Pe W/cm &/or Wo Cm  04/21/2014   CLINICAL DATA:  Hypertension and chest pain  EXAM: CT ANGIOGRAPHY CHEST WITH CONTRAST  TECHNIQUE: Multidetector CT imaging of the chest was performed using the standard protocol during bolus administration of intravenous contrast. Multiplanar CT image reconstructions and MIPs were obtained to evaluate the vascular anatomy.  CONTRAST:  163mL OMNIPAQUE IOHEXOL 350 MG/ML SOLN  COMPARISON:  03/03/2014  FINDINGS: THORACIC INLET/BODY WALL:  No  acute abnormality.  MEDIASTINUM:  Normal heart size. No pericardial effusion. No acute vascular abnormality, including pulmonary embolism. There is atherosclerosis, including the coronary arteries, present throughout the left main and LAD. 4 cm fusiform aneurysmal enlargement of the proximal descending aorta, unchanged from approximately 2 months ago. No indication of dissection or intramural hematoma. No adenopathy.  LUNG WINDOWS:  Clustering of tiny pulmonary nodules in left upper lobe has developed from approximately 2 months ago and is most consistent with an inflammatory process. There is no suspected pneumonia.  UPPER ABDOMEN:  No acute findings.  OSSEOUS:  No acute fracture.  No suspicious lytic or blastic lesions.  Review of the MIP images confirms the above findings.  IMPRESSION: 1. Negative for pulmonary embolism or other acute intrathoracic finding. 2. 4 cm descending thoracic aortic aneurysm. Recommend semi-annual imaging followup by CTA or MRA and referral to cardiothoracic surgery if not already obtained. This recommendation follows 2010 ACCF/AHA/AATS/ACR/ASA/SCA/SCAI/SIR/STS/SVM Guidelines for the Diagnosis and Management of Patients With Thoracic Aortic Disease. Circulation. 2010; 121: e266-e36 3. Coronary atherosclerosis.   Electronically Signed   By: Monte Fantasia M.D.   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

## 2014-04-21 NOTE — H&P (Signed)
[facility] History and Physical  Geoffrey [name] EHU:[id number] DOB: 1950-05-19 DOA: 04/20/2014  Referring physician: EDP PCP: [name], MD   Chief Complaint: Chest pain   HPI: Geoffrey KUNZ is a 64 y.o. male h/o bicuspid aortic valve with aortic root dilation, who presents to the ED with HTN and chest pain.  Pain onset around 8pm last evening, was persistent until he was given NTG / morphine in the ED after which it has resolved completely.  There was associated HTN with BP of 220/100 initially per EMS, this improved to 785 systolic after NTG and morphine in ED and has remained 170s or less since then.  Chest pain was located in his left chest with radiation to left shoulder, pressure like sensation.  While in the ED, patient spiked a fever of over 101.  He actually just had 5 teeth extracted a couple of days ago, he was put on a 7 day course of amoxicillin and told to start this post-op.  Review of Systems: Systems reviewed.  As above, otherwise negative  Past Medical History  Diagnosis Date  . Hyperkalemia   . Dyslipidemia   . Gastric ulcer   . Renal cell cancer   . PAC (premature atrial contraction)   . Dilated aortic root   . HTN (hypertension)   . Heart murmur   . Arthritis   . Kidney stone   . Aortic regurgitation   . Aortic aneurysm    Past Surgical History  Procedure Laterality Date  . Cataract extraction    . Kidney surgery Left     partial nephrectomy  . Knee surgery    . Tonsillectomy    . Knee arthroscopy with medial menisectomy Left 04/07/2013    Procedure: LEFT KNEE ARTHROSCOPY WITH MEDIAL MENISCECTOMY ABRASION CHONDROPLASTY OF THE PATELLA  AND MEDIAL FEMORAL CONDYLE, MICROFRACTURE TECHNIQUE OF THE MEDIAL FEMORAL CONDYLE ;  Surgeon: Tobi Bastos, MD;  Location: [facility];  Service: Orthopedics;  Laterality: Left;  . Back surgery  2012    lumbar  . Cystoscopy with retrograde pyelogram, ureteroscopy and stent placement Left 08/04/2013    Procedure:  CYSTOSCOPY, URETEROSCOPY, RETROGRADES;  Surgeon: Raynelle Bring, MD;  Location: [facility];  Service: Urology;  Laterality: Left;  . Holmium laser application Left 8/85/0277    Procedure: HOLMIUM LASER APPLICATION;  Surgeon: Raynelle Bring, MD;  Location: [facility];  Service: Urology;  Laterality: Left;   Social History:  reports that he quit smoking about 36 years ago. He has never used smokeless tobacco. He reports that he drinks alcohol. He reports that he does not use illicit drugs.  Allergies  Allergen Reactions  . Zetia [Ezetimibe]     rash  . Shrimp [Shellfish Allergy] Rash    Family History  Problem Relation Age of Onset  . Hypertension Father   . Lung cancer Father   . Dementia Mother   . Hypertension Sister   . Breast cancer Sister      Prior to Admission medications   Medication Sig Start Date End Date Taking? Authorizing Provider  amoxicillin (AMOXIL) 500 MG tablet Take 1 tablet by mouth 3 (three) times daily. For 7 days 04/13/14  Yes Historical Provider, MD  aspirin EC 81 MG tablet Take 81 mg by mouth daily.   Yes Historical Provider, MD  chlorhexidine (PERIDEX) 0.12 % solution 15 mLs. Swish for 30 sec then spit twice daily 04/13/14  Yes Historical Provider, MD  losartan (COZAAR) 50 MG tablet Take 50 mg by  mouth daily.   Yes Historical Provider, MD  metoprolol succinate (TOPROL-XL) 25 MG 24 hr tablet Take 12.5 mg by mouth 2 (two) times daily.   Yes Historical Provider, MD  Omega-3 Fatty Acids (FISH OIL PO) Take 1,000 mg by mouth 2 (two) times daily.    Yes Historical Provider, MD  oxyCODONE-acetaminophen (PERCOCET) 10-325 MG per tablet Take 1 tablet by mouth every 4 (four) hours as needed. for pain 04/13/14  Yes Historical Provider, MD  rosuvastatin (CRESTOR) 40 MG tablet Take 40 mg by mouth daily.   Yes Historical Provider, MD  triamcinolone (NASACORT ALLERGY 24HR) 55 MCG/ACT AERO nasal inhaler Place 1 spray into the nose daily.   Yes Historical Provider, MD   Physical  Exam: Filed Vitals:   04/21/14 0250  BP: 173/79  Pulse: 75  Temp: 98.7 F (37.1 C)  Resp: 20    BP 173/79 mmHg  Pulse 75  Temp(Src) 98.7 F (37.1 C) (Oral)  Resp 20  SpO2 99%  General Appearance:    Alert, oriented, no distress, appears stated age  Head:    Normocephalic, atraumatic  Eyes:    PERRL, EOMI, sclera non-icteric        Nose:   Nares without drainage or epistaxis. Mucosa, turbinates normal  Throat:   Moist mucous membranes. Oropharynx without erythema or exudate.  Neck:   Supple. No carotid bruits.  No thyromegaly.  No lymphadenopathy.   Back:     No CVA tenderness, no spinal tenderness  Lungs:     Clear to auscultation bilaterally, without wheezes, rhonchi or rales  Chest wall:    No tenderness to palpitation  Heart:    Regular rate and rhythm without murmurs, gallops, rubs  Abdomen:     Soft, non-tender, nondistended, normal bowel sounds, no organomegaly  Genitalia:    deferred  Rectal:    deferred  Extremities:   No clubbing, cyanosis or edema.  Pulses:   2+ and symmetric all extremities  Skin:   Skin color, texture, turgor normal, no rashes or lesions  Lymph nodes:   Cervical, supraclavicular, and axillary nodes normal  Neurologic:   CNII-XII intact. Normal strength, sensation and reflexes      throughout    Labs on Admission:  Basic Metabolic Panel:  Recent Labs Lab 04/20/14 2327  NA 142  K 4.9  CL 106  CO2 24  GLUCOSE 95  BUN 14  CREATININE 1.02  CALCIUM 9.8   Liver Function Tests: No results for input(s): AST, ALT, ALKPHOS, BILITOT, PROT, ALBUMIN in the last 168 hours. No results for input(s): LIPASE, AMYLASE in the last 168 hours. No results for input(s): AMMONIA in the last 168 hours. CBC:  Recent Labs Lab 04/20/14 2327  WBC 7.8  HGB 14.7  HCT 46.0  MCV 89.5  PLT 210   Cardiac Enzymes: No results for input(s): CKTOTAL, CKMB, CKMBINDEX, TROPONINI in the last 168 hours.  BNP (last 3 results) No results for input(s): PROBNP in  the last 8760 hours. CBG: No results for input(s): GLUCAP in the last 168 hours.  Radiological Exams on Admission: Dg Chest 2 View  04/21/2014   CLINICAL DATA:  Hypertension and chest pain  EXAM: CHEST  2 VIEW  COMPARISON:  06/02/2013  FINDINGS: Mild cardiomegaly with aortic tortuosity/enlargement - no interval change. There is no edema, consolidation, effusion, or pneumothorax. No acute osseous findings to explain chest pain.  IMPRESSION: 1. No acute findings. 2. Unchanged appearance of the mediastinum in this patient with history  of thoracic aortic aneurysm.   Electronically Signed   By: Monte Fantasia M.D.   On: 04/21/2014 01:08   Ct Head Wo Contrast  04/21/2014   CLINICAL DATA:  Hypertension and headache.  EXAM: CT HEAD WITHOUT CONTRAST  TECHNIQUE: Contiguous axial images were obtained from the base of the skull through the vertex without intravenous contrast.  COMPARISON:  07/14/2010  FINDINGS: Skull and Sinuses:Negative for fracture or destructive process. Small nonobstructive polyps or mucous retention cysts in the left maxillary sinus.  Orbits: Bilateral cataract resection.  Brain: No evidence of acute infarction, hemorrhage, hydrocephalus, or mass lesion/mass effect. Dilated perivascular space noted below the left putamen.  IMPRESSION: Negative head CT.   Electronically Signed   By: Monte Fantasia M.D.   On: 04/21/2014 02:36   Ct Angio Chest Pe W/cm &/or Wo Cm  04/21/2014   CLINICAL DATA:  Hypertension and chest pain  EXAM: CT ANGIOGRAPHY CHEST WITH CONTRAST  TECHNIQUE: Multidetector CT imaging of the chest was performed using the standard protocol during bolus administration of intravenous contrast. Multiplanar CT image reconstructions and MIPs were obtained to evaluate the vascular anatomy.  CONTRAST:  142mL OMNIPAQUE IOHEXOL 350 MG/ML SOLN  COMPARISON:  03/03/2014  FINDINGS: THORACIC INLET/BODY WALL:  No acute abnormality.  MEDIASTINUM:  Normal heart size. No pericardial effusion. No acute  vascular abnormality, including pulmonary embolism. There is atherosclerosis, including the coronary arteries, present throughout the left main and LAD. 4 cm fusiform aneurysmal enlargement of the proximal descending aorta, unchanged from approximately 2 months ago. No indication of dissection or intramural hematoma. No adenopathy.  LUNG WINDOWS:  Clustering of tiny pulmonary nodules in left upper lobe has developed from approximately 2 months ago and is most consistent with an inflammatory process. There is no suspected pneumonia.  UPPER ABDOMEN:  No acute findings.  OSSEOUS:  No acute fracture.  No suspicious lytic or blastic lesions.  Review of the MIP images confirms the above findings.  IMPRESSION: 1. Negative for pulmonary embolism or other acute intrathoracic finding. 2. 4 cm descending thoracic aortic aneurysm. Recommend semi-annual imaging followup by CTA or MRA and referral to cardiothoracic surgery if not already obtained. This recommendation follows 2010 ACCF/AHA/AATS/ACR/ASA/SCA/SCAI/SIR/STS/SVM Guidelines for the Diagnosis and Management of Patients With Thoracic Aortic Disease. Circulation. 2010; 121: e266-e36 3. Coronary atherosclerosis.   Electronically Signed   By: Monte Fantasia M.D.   On: 04/21/2014 02:53    EKG: Independently reviewed.  Assessment/Plan Principal Problem:   Chest pain Active Problems:   Hypertensive urgency   1. Chest pain - DDX includes hypertensive urgency, less likely CAD vs infectious (endocarditis) 1. Chest pain obs pathway 2. Serial troponins 3. Tele monitor 4. Continue home BP meds, adding PRN hydralazine for SBP > 170 5. Blood cultures 6. Continue amoxicillin 7. NPO for possible stress test    Code Status: Full Code  Family Communication: No family in room Disposition Plan: Admit to obs   Time spent: 70 min  Darl Kuss M. [facility] Pager [phone]  If 7AM-7PM, please contact the day team taking care of the  patient [email or web address] Password [email or web address] 04/21/2014, 5:05 AM

## 2014-04-22 DIAGNOSIS — I714 Abdominal aortic aneurysm, without rupture: Secondary | ICD-10-CM | POA: Diagnosis not present

## 2014-04-22 DIAGNOSIS — R079 Chest pain, unspecified: Secondary | ICD-10-CM | POA: Diagnosis not present

## 2014-04-22 DIAGNOSIS — I359 Nonrheumatic aortic valve disorder, unspecified: Secondary | ICD-10-CM | POA: Diagnosis not present

## 2014-04-22 LAB — BASIC METABOLIC PANEL
Anion gap: 4 — ABNORMAL LOW (ref 5–15)
BUN: 13 mg/dL (ref 6–23)
CO2: 27 mmol/L (ref 19–32)
Calcium: 9.3 mg/dL (ref 8.4–10.5)
Chloride: 108 mmol/L (ref 96–112)
Creatinine, Ser: 1.11 mg/dL (ref 0.50–1.35)
GFR calc Af Amer: 80 mL/min — ABNORMAL LOW (ref >90)
GFR calc non Af Amer: 69 mL/min — ABNORMAL LOW (ref >90)
Glucose, Bld: 99 mg/dL (ref 70–99)
Potassium: 4.4 mmol/L (ref 3.5–5.1)
Sodium: 139 mmol/L (ref 135–145)

## 2014-04-24 ENCOUNTER — Telehealth: Payer: Self-pay | Admitting: Cardiology

## 2014-04-24 MED ORDER — METOPROLOL TARTRATE 25 MG PO TABS: 25.0000 mg | ORAL_TABLET | Freq: Two times a day (BID) | ORAL | Status: DC

## 2014-04-24 MED ORDER — METOPROLOL SUCCINATE ER 25 MG PO TB24: 25.0000 mg | ORAL_TABLET | Freq: Two times a day (BID) | ORAL | Status: DC

## 2014-04-24 NOTE — Telephone Encounter (Signed)
New message      Pt c/o BP issue: STAT if pt c/o blurred vision, one-sided weakness or slurred speech  1. What are your last 5 BP readings? 215/112  , 220/110 [place] fire station---went to ER, 202/107, 180/102, 188/79 this am  2. Are you having any other symptoms (ex. Dizziness, headache, blurred vision, passed out)? Bad headache 3. What is your BP issue? Bp is out of control, medication is not working.  Pt want an appt ASAP.  He said Dr Lucianne Lei tright changed his bp medication to losartan/potassium 50mg  but it is not helping.

## 2014-04-24 NOTE — Telephone Encounter (Signed)
Patient concerned over HTN and headache at pain 6-7/10. Patient is taking his medications as directed.  BP 188/79, HR 77. Per Lyda Jester (flex), instructed patient to INCREASE METOPROLOL to 25 mg BID. Per [facility] records, verified with patient he is taking metoprolol tartrate, not metoprolol succinate as listed from hospital records. Med list corrected. Instructed patient to check his BP and HR BID for 3 days and call with results.  Patient agrees with treatment plan.   Patient also concerned about price for Crestor and asked for a different medication. Informed patient that Crestor will be going generic next month and to check with his pharmacy and insurance before requesting a medication change, especially since he is intolerant to zetia and lipitor. Patient st he will check and is grateful for assistance.

## 2014-04-24 NOTE — Discharge Summary (Signed)
Physician Discharge Summary  Geoffrey [name]:[id number] DOB: June 27, 1950 DOA: 04/20/2014  PCP: [name], MD  Admit date: 04/20/2014 Discharge date: 04/22/2014  Time spent: 45 minutes  Recommendations for Outpatient Follow-up:  1. Continued surveillance of descending AAA  Discharge Diagnoses:  Principal Problem:   Chest pain Active Problems:   Aortic valve disorder   Dilated aortic root   Hypertensive urgency   Fever status post recent extraction of 5 teeth   AAA (abdominal aortic aneurysm) without rupture   Chest pain at rest   Discharge Condition: stable  Diet recommendation: heart healthy  Filed Weights   04/21/14 0549 04/21/14 1517 04/22/14 0607  Weight: 106.9 kg (235 lb 10.8 oz) 107.956 kg (238 lb) 104.781 kg (231 lb)    History of present illness:  Chief Complaint: Chest pain HPI: Geoffrey [name] is a 64 y.o. 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 Chest pain was located in his left chest with radiation to left shoulder, pressure like sensation  Hospital Course:  Atypical Chest pain - admitted to telemetry, troponin minimally elevated - CT angiogram negative for pulmonary embolism. -left heart Cath with normal coronaries -symptoms resolved, discharged  Home in a stable condition   Descending AAA - We'll need semi-annual imaging to ensure stability.   Aortic valve disorder/Dilated aortic root - Control blood pressure.   Hypertensive urgency - Continue Cozaar, Toprol-XL   Fever status post recent extraction of 5 teeth - Amoxicillin resumed. Continue Peridex mouth rinse.   Procedures: Left Heart Cath Impression: 1. No angiographic evidence of CAD 2. Normal LV systolic function  3. Non-cardiac chest pain  Consultations:  cards  Discharge Exam: Filed Vitals:   04/22/14 0607  BP: 156/84  Pulse: 69  Temp: 98.5 F (36.9 C)  Resp: 16    General: AAOx3 Cardiovascular:  S1S2/RRR Respiratory: CTAB  Discharge Instructions   Discharge Instructions    Diet - low sodium heart healthy    Complete by:  As directed      Increase activity slowly    Complete by:  As directed           Discharge Medication List as of 04/22/2014  9:05 AM    CONTINUE these medications which have NOT CHANGED   Details  amoxicillin (AMOXIL) 500 MG tablet Take 1 tablet by mouth 3 (three) times daily. For 7 days, Starting 04/13/2014, Until Discontinued, Historical Med    aspirin EC 81 MG tablet Take 81 mg by mouth daily., Until Discontinued, Historical Med    chlorhexidine (PERIDEX) 0.12 % solution 15 mLs. Swish for 30 sec then spit twice daily, Starting 04/13/2014, Until Discontinued, Historical Med    losartan (COZAAR) 50 MG tablet Take 50 mg by mouth daily., Until Discontinued, Historical Med    Omega-3 Fatty Acids (FISH OIL PO) Take 1,000 mg by mouth 2 (two) times daily. , Until Discontinued, Historical Med    oxyCODONE-acetaminophen (PERCOCET) 10-325 MG per tablet Take 1 tablet by mouth every 4 (four) hours as needed. for pain, Starting 04/13/2014, Until Discontinued, Historical Med    rosuvastatin (CRESTOR) 40 MG tablet Take 40 mg by mouth daily., Until Discontinued, Historical Med    triamcinolone (NASACORT ALLERGY 24HR) 55 MCG/ACT AERO nasal inhaler Place 1 spray into the nose daily., Until Discontinued, Historical Med    metoprolol succinate (TOPROL-XL) 25 MG 24 hr tablet Take 12.5 mg by mouth 2 (two) times daily., Until Discontinued, Historical Med  Allergies  Allergen Reactions  . Zetia [Ezetimibe]     rash  . Shrimp [Shellfish Allergy] Rash   Follow-up Information    Follow up with [name], MD. Schedule an appointment as soon as possible for a visit in 2 weeks.   Specialty:  Family Medicine   Contact information:   [address] [place] [address] [phone]        The results of significant diagnostics from this  hospitalization (including imaging, microbiology, ancillary and laboratory) are listed below for reference.    Significant Diagnostic Studies: Dg Chest 2 View  04/21/2014   CLINICAL DATA:  Hypertension and chest pain  EXAM: CHEST  2 VIEW  COMPARISON:  06/02/2013  FINDINGS: Mild cardiomegaly with aortic tortuosity/enlargement - no interval change. There is no edema, consolidation, effusion, or pneumothorax. No acute osseous findings to explain chest pain.  IMPRESSION: 1. No acute findings. 2. Unchanged appearance of the mediastinum in this patient with history of thoracic aortic aneurysm.   Electronically Signed   By: Monte Fantasia M.D.   On: 04/21/2014 01:08   Ct Head Wo Contrast  04/21/2014   CLINICAL DATA:  Hypertension and headache.  EXAM: CT HEAD WITHOUT CONTRAST  TECHNIQUE: Contiguous axial images were obtained from the base of the skull through the vertex without intravenous contrast.  COMPARISON:  07/14/2010  FINDINGS: Skull and Sinuses:Negative for fracture or destructive process. Small nonobstructive polyps or mucous retention cysts in the left maxillary sinus.  Orbits: Bilateral cataract resection.  Brain: No evidence of acute infarction, hemorrhage, hydrocephalus, or mass lesion/mass effect. Dilated perivascular space noted below the left putamen.  IMPRESSION: Negative head CT.   Electronically Signed   By: Monte Fantasia M.D.   On: 04/21/2014 02:36   Ct Angio Chest Pe W/cm &/or Wo Cm  04/21/2014   CLINICAL DATA:  Hypertension and chest pain  EXAM: CT ANGIOGRAPHY CHEST WITH CONTRAST  TECHNIQUE: Multidetector CT imaging of the chest was performed using the standard protocol during bolus administration of intravenous contrast. Multiplanar CT image reconstructions and MIPs were obtained to evaluate the vascular anatomy.  CONTRAST:  183mL OMNIPAQUE IOHEXOL 350 MG/ML SOLN  COMPARISON:  03/03/2014  FINDINGS: THORACIC INLET/BODY WALL:  No acute abnormality.  MEDIASTINUM:  Normal heart size. No  pericardial effusion. No acute vascular abnormality, including pulmonary embolism. There is atherosclerosis, including the coronary arteries, present throughout the left main and LAD. 4 cm fusiform aneurysmal enlargement of the proximal descending aorta, unchanged from approximately 2 months ago. No indication of dissection or intramural hematoma. No adenopathy.  LUNG WINDOWS:  Clustering of tiny pulmonary nodules in left upper lobe has developed from approximately 2 months ago and is most consistent with an inflammatory process. There is no suspected pneumonia.  UPPER ABDOMEN:  No acute findings.  OSSEOUS:  No acute fracture.  No suspicious lytic or blastic lesions.  Review of the MIP images confirms the above findings.  IMPRESSION: 1. Negative for pulmonary embolism or other acute intrathoracic finding. 2. 4 cm descending thoracic aortic aneurysm. Recommend semi-annual imaging followup by CTA or MRA and referral to cardiothoracic surgery if not already obtained. This recommendation follows 2010 ACCF/AHA/AATS/ACR/ASA/SCA/SCAI/SIR/STS/SVM Guidelines for the Diagnosis and Management of Patients With Thoracic Aortic Disease. Circulation. 2010; 121: e266-e36 3. Coronary atherosclerosis.   Electronically Signed   By: Monte Fantasia M.D.   On: 04/21/2014 02:53    Microbiology: Recent Results (from the past 240 hour(s))  Culture, blood (routine x 2)  Status: None (Preliminary result)   Collection Time: 04/21/14  5:18 AM  Result Value Ref Range Status   Specimen Description BLOOD RIGHT ANTECUBITAL  Final   Special Requests BOTTLES DRAWN AEROBIC AND ANAEROBIC 5ML  Final   Culture   Final           BLOOD CULTURE RECEIVED NO GROWTH TO DATE CULTURE WILL BE HELD FOR 5 DAYS BEFORE ISSUING A FINAL NEGATIVE REPORT Performed at [facility]    Report Status PENDING  Incomplete  Culture, blood (routine x 2)     Status: None (Preliminary result)   Collection Time: 04/21/14  5:20 AM  Result Value Ref Range  Status   Specimen Description BLOOD RIGHT HAND  Final   Special Requests BOTTLES DRAWN AEROBIC AND ANAEROBIC 5ML  Final   Culture   Final           BLOOD CULTURE RECEIVED NO GROWTH TO DATE CULTURE WILL BE HELD FOR 5 DAYS BEFORE ISSUING A FINAL NEGATIVE REPORT Performed at [facility]    Report Status PENDING  Incomplete     Labs: Basic Metabolic Panel:  Recent Labs Lab 04/20/14 2327 04/21/14 1033 04/22/14 0302  NA 142 137 139  K 4.9 4.5 4.4  CL 106 105 108  CO2 24 24 27   GLUCOSE 95 149* 99  BUN 14 15 13   CREATININE 1.02 1.25 1.11  CALCIUM 9.8 9.1 9.3   Liver Function Tests:  Recent Labs Lab 04/21/14 1031  AST 35  ALT 36  ALKPHOS 80  BILITOT 0.6  PROT 7.2  ALBUMIN 4.2   No results for input(s): LIPASE, AMYLASE in the last 168 hours. No results for input(s): AMMONIA in the last 168 hours. CBC:  Recent Labs Lab 04/20/14 2327  WBC 7.8  HGB 14.7  HCT 46.0  MCV 89.5  PLT 210   Cardiac Enzymes:  Recent Labs Lab 04/21/14 0508 04/21/14 1031  TROPONINI 0.04* 0.03   BNP: BNP (last 3 results)  Recent Labs  04/20/14 2327  BNP 90.1    ProBNP (last 3 results) No results for input(s): PROBNP in the last 8760 hours.  CBG: No results for input(s): GLUCAP in the last 168 hours.     SignedDomenic Polite  [facility] 04/24/2014, 5:09 PM

## 2014-04-25 ENCOUNTER — Other Ambulatory Visit: Payer: Self-pay | Admitting: Cardiology

## 2014-04-25 NOTE — Telephone Encounter (Signed)
If [employer], he can receive the Crestor discount card. 3 month supply for $3. There are cards at [facility]. Should also be available at [facility].  If not, he can stop by [facility] to pick one up.

## 2014-04-27 LAB — CULTURE, BLOOD (ROUTINE X 2)
Culture: NO GROWTH
Culture: NO GROWTH

## 2014-07-18 ENCOUNTER — Telehealth: Payer: Self-pay | Admitting: Cardiology

## 2014-07-18 NOTE — Telephone Encounter (Signed)
Have recall for patient to have CTA done in Aug.  Spoke with the CT Dept to schedule.   She stated that it was too soon to do, because he had one done at [facility] on 04-21-14. Please advise.

## 2014-07-26 ENCOUNTER — Other Ambulatory Visit: Payer: Self-pay

## 2014-07-26 DIAGNOSIS — I77810 Thoracic aortic ectasia: Secondary | ICD-10-CM

## 2014-07-26 NOTE — Telephone Encounter (Signed)
New order for CTA placed for scheduling.

## 2014-07-26 NOTE — Telephone Encounter (Signed)
Change to October 2016

## 2014-07-26 NOTE — Telephone Encounter (Signed)
Patient will need a new order.

## 2014-08-31 ENCOUNTER — Other Ambulatory Visit: Payer: Self-pay | Admitting: *Deleted

## 2014-08-31 DIAGNOSIS — I712 Thoracic aortic aneurysm, without rupture, unspecified: Secondary | ICD-10-CM

## 2014-09-05 ENCOUNTER — Telehealth: Payer: Self-pay | Admitting: *Deleted

## 2014-09-05 NOTE — Telephone Encounter (Signed)
Patient called stating that he had relocated to [facility] [place], and requested that his medical records be mailed to him after signed authorization.

## 2014-09-06 ENCOUNTER — Other Ambulatory Visit: Payer: Self-pay | Admitting: *Deleted

## 2014-09-06 MED ORDER — ROSUVASTATIN CALCIUM 40 MG PO TABS: 40.0000 mg | ORAL_TABLET | Freq: Every evening | ORAL | Status: DC

## 2014-09-06 NOTE — Telephone Encounter (Signed)
Patient wife called for a one month refill on the crestor as they have relocated to [place]. Patient has his first appointment with his new physician tomorrow.

## 2014-09-27 ENCOUNTER — Encounter: Payer: Self-pay | Admitting: Cardiothoracic Surgery

## 2014-10-03 ENCOUNTER — Other Ambulatory Visit: Payer: Self-pay | Admitting: Cardiology

## 2014-10-23 ENCOUNTER — Inpatient Hospital Stay: Admission: RE | Admit: 2014-10-23 | Payer: Self-pay | Source: Ambulatory Visit

## 2014-10-25 ENCOUNTER — Encounter: Payer: Self-pay | Admitting: Cardiothoracic Surgery

## 2014-11-15 ENCOUNTER — Other Ambulatory Visit: Payer: Self-pay | Admitting: Cardiology

## 2015-03-09 ENCOUNTER — Other Ambulatory Visit: Payer: Self-pay | Admitting: Cardiology

## 2016-04-02 IMAGING — CT CT ANGIO CHEST
1 of 2 series · 19 of 32 positions shown · IV contrast ([ID] [id number])
Comparison: 03/03/2014

CLINICAL DATA: Hypertension and chest pain

EXAM:
CT ANGIOGRAPHY CHEST WITH CONTRAST
TECHNIQUE: Multidetector CT imaging of the chest was performed using the
standard protocol during bolus administration of intravenous
contrast. Multiplanar CT image reconstructions and MIPs were
obtained to evaluate the vascular anatomy.
CONTRAST:  100mL OMNIPAQUE IOHEXOL 350 MG/ML SOLN

[Series 5: thins for pacs · axial · 0.76mm/px · z∈[+218,+466]mm · 19 of 276 slices shown]
[im 14/276  lung]
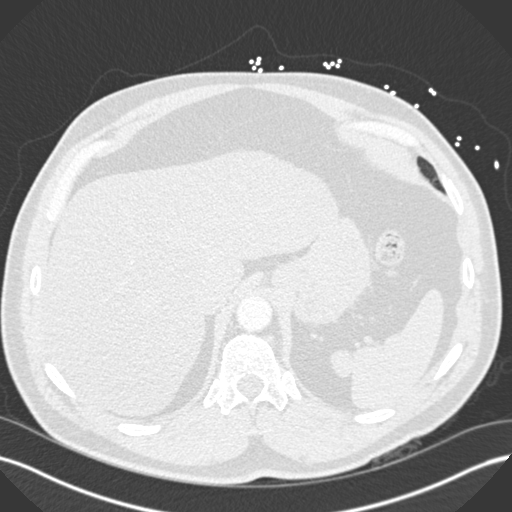
[im 28/276  mediastinal]
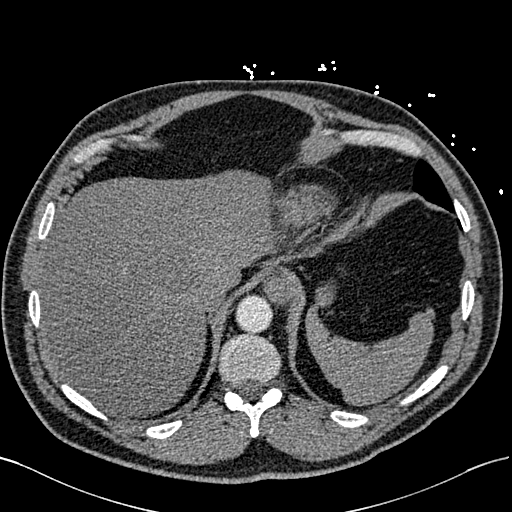
[im 42/276  lung]
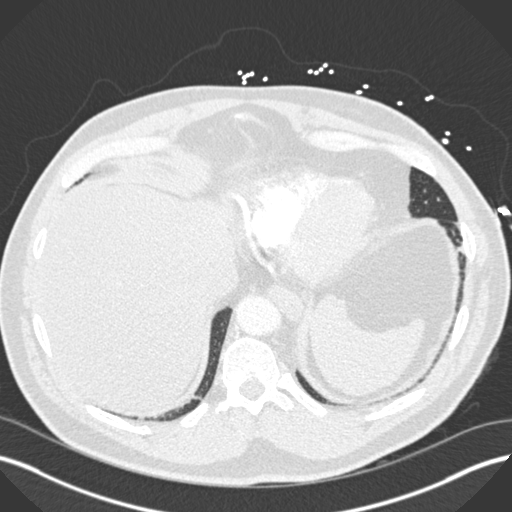
[im 69/276  mediastinal]
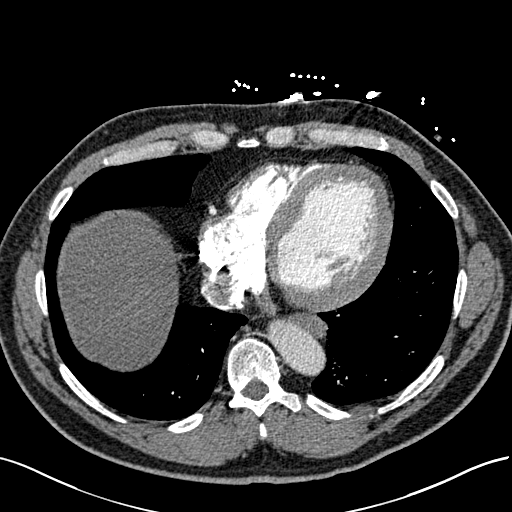
[im 83/276  lung]
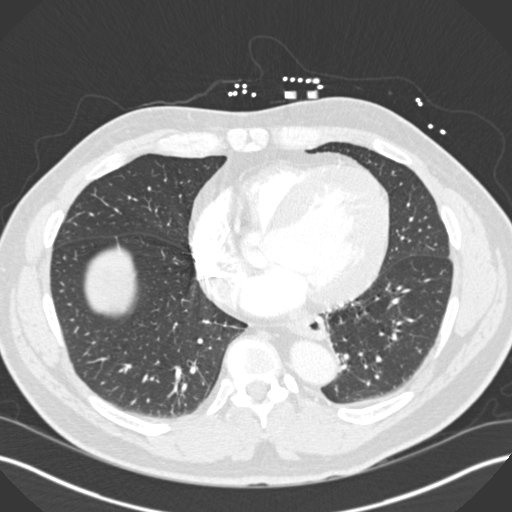
[im 92/276  mediastinal]
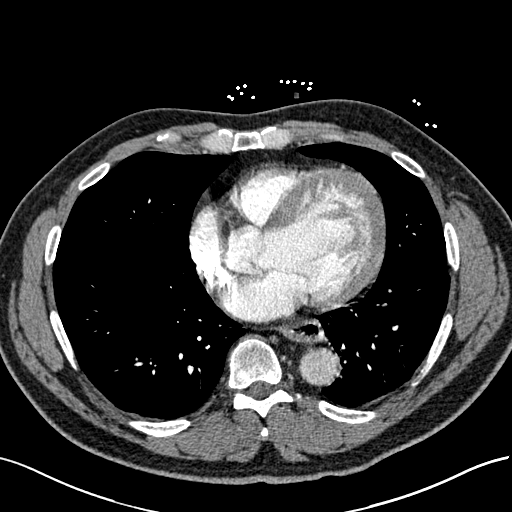
[im 97/276  lung]
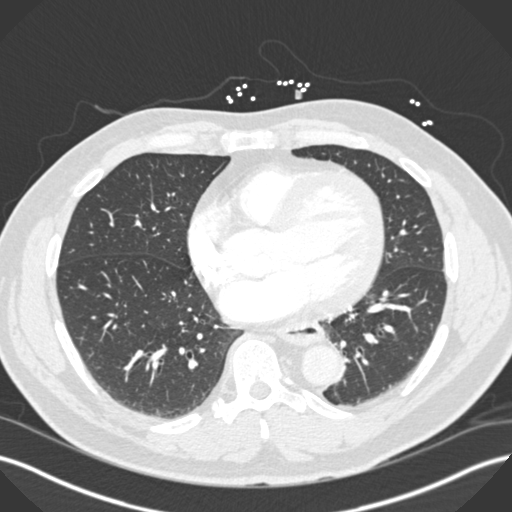
[im 111/276  mediastinal]
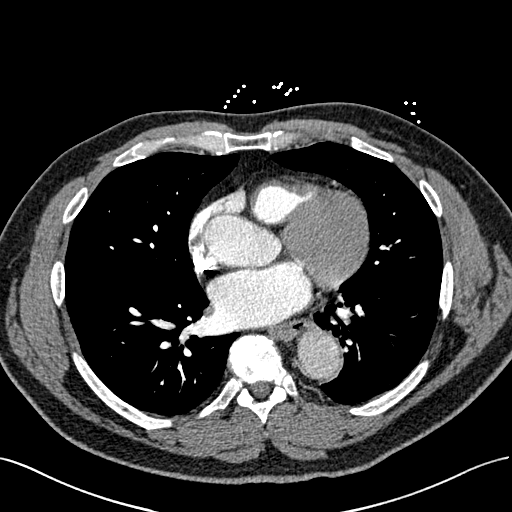
[im 124/276  lung]
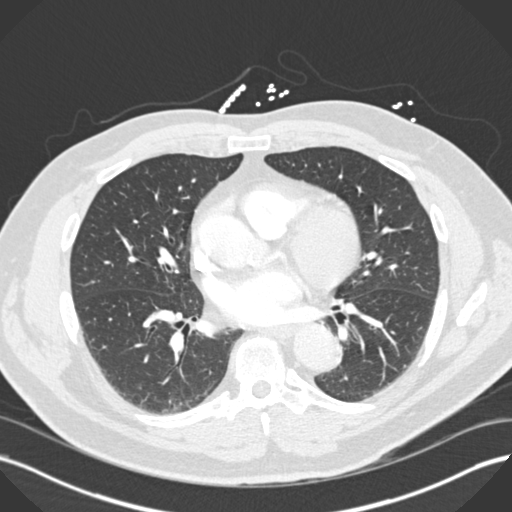
[im 138/276  mediastinal]
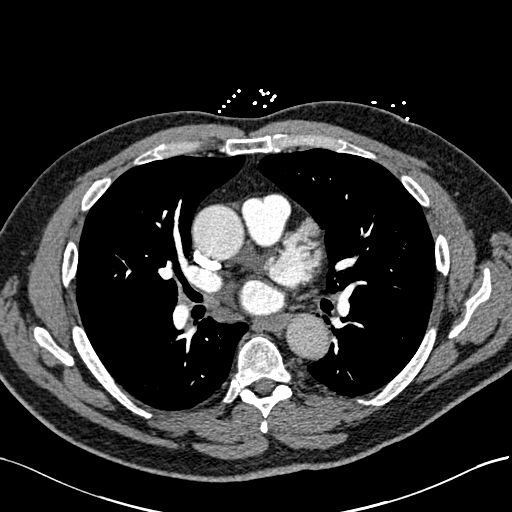
[im 152/276  lung]
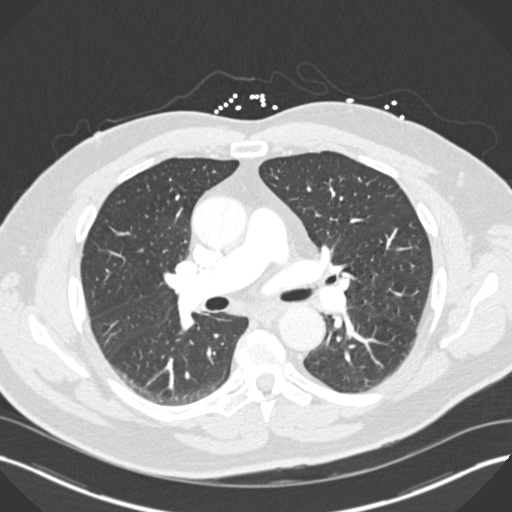
[im 166/276  mediastinal]
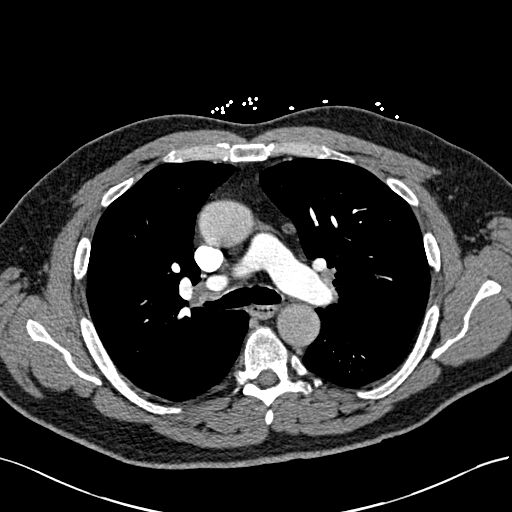
[im 179/276  lung]
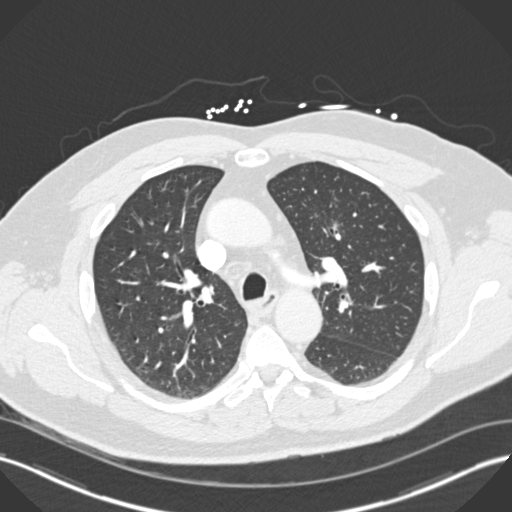
[im 184/276  mediastinal]
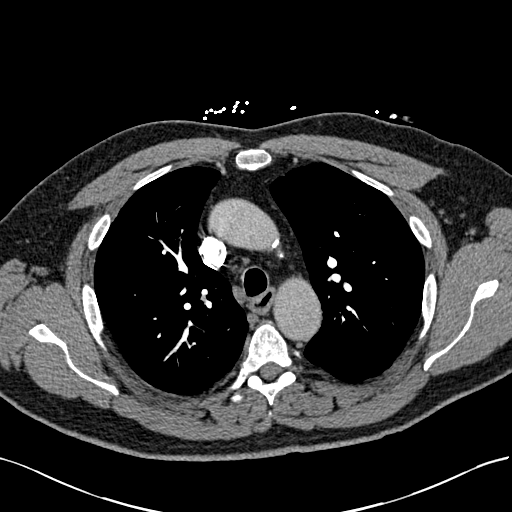
[im 193/276  lung]
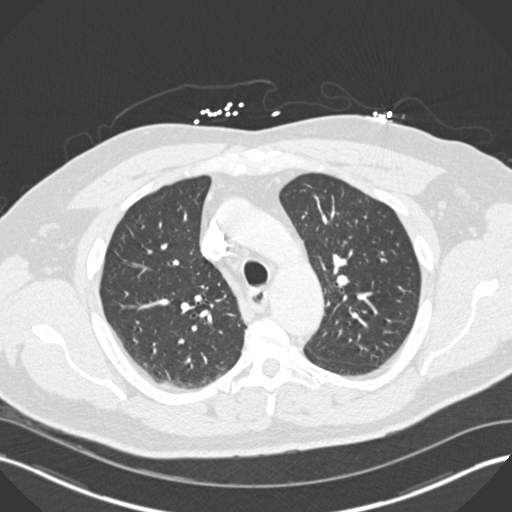
[im 207/276  mediastinal]
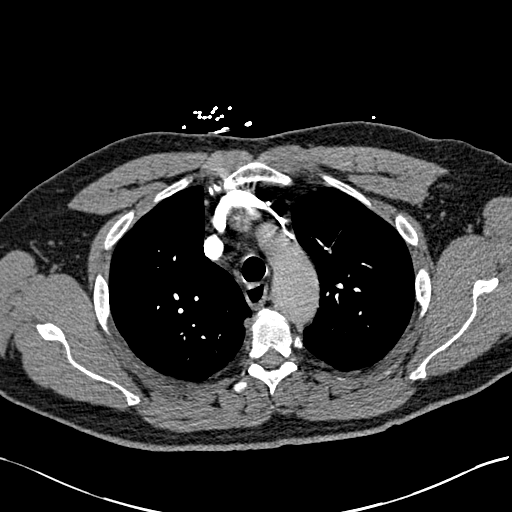
[im 234/276  lung]
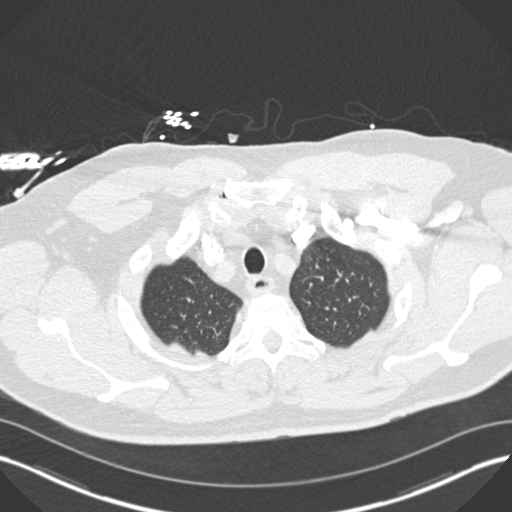
[im 248/276  mediastinal]
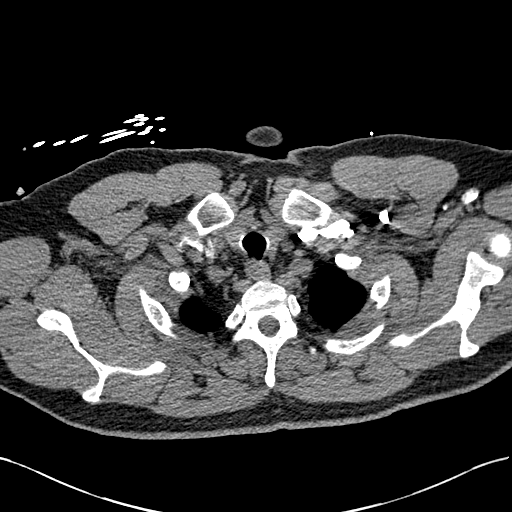
[im 262/276  lung]
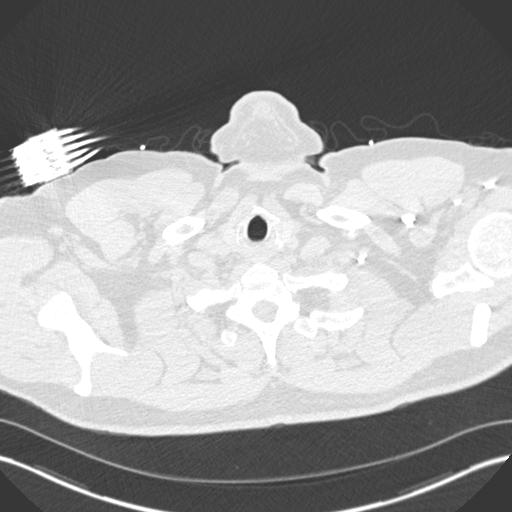

[19 of 32 positions shown; findings below may reference images not displayed]

FINDINGS: THORACIC INLET/BODY WALL:

No acute abnormality.

MEDIASTINUM:

Normal heart size. No pericardial effusion. No acute vascular
abnormality, including pulmonary embolism. There is atherosclerosis,
including the coronary arteries, present throughout the left main
and LAD. 4 cm fusiform aneurysmal enlargement of the proximal
descending aorta, unchanged from approximately 2 months ago. No
indication of dissection or intramural hematoma. No adenopathy.

LUNG WINDOWS:

Clustering of tiny pulmonary nodules in left upper lobe has
developed from approximately 2 months ago and is most consistent
with an inflammatory process. There is no suspected pneumonia.

UPPER ABDOMEN:

No acute findings.

OSSEOUS:

No acute fracture.  No suspicious lytic or blastic lesions.

Review of the MIP images confirms the above findings.
IMPRESSION: 1. Negative for pulmonary embolism or other acute intrathoracic
finding.
2. 4 cm descending thoracic aortic aneurysm. Recommend semi-annual
imaging followup by CTA or MRA and referral to cardiothoracic
surgery if not already obtained. This recommendation follows 4222
ACCF/AHA/AATS/ACR/ASA/SCA/SORJA/TIGER/KWNSTANTINOS/EGILIJUS Guidelines for the
Diagnosis and Management of Patients With Thoracic Aortic Disease.
Circulation. 4222; 121: [id number]
3. Coronary atherosclerosis.

## 2021-10-13 DEATH — deceased
